# Patient Record
Sex: Male | Born: 1940 | Race: White | Hispanic: No | Marital: Married
Health system: Midwestern US, Academic
[De-identification: ages and names within clinical notes are randomized; demographics above are authoritative.]

---

## 2017-02-22 ENCOUNTER — Encounter: Admit: 2017-02-22 | Discharge: 2017-02-22 | Payer: MEDICARE

## 2017-02-22 ENCOUNTER — Ambulatory Visit: Admit: 2017-02-22 | Discharge: 2017-02-22 | Payer: MEDICARE

## 2017-02-22 DIAGNOSIS — R1319 Other dysphagia: ICD-10-CM

## 2017-02-22 DIAGNOSIS — I00 Rheumatic fever without heart involvement: ICD-10-CM

## 2017-02-22 DIAGNOSIS — E785 Hyperlipidemia, unspecified: ICD-10-CM

## 2017-02-22 DIAGNOSIS — R196 Halitosis: ICD-10-CM

## 2017-02-22 DIAGNOSIS — Z862 Personal history of diseases of the blood and blood-forming organs and certain disorders involving the immune mechanism: ICD-10-CM

## 2017-02-22 DIAGNOSIS — I10 Essential (primary) hypertension: ICD-10-CM

## 2017-02-22 DIAGNOSIS — I2510 Atherosclerotic heart disease of native coronary artery without angina pectoris: Principal | ICD-10-CM

## 2017-02-22 DIAGNOSIS — K625 Hemorrhage of anus and rectum: ICD-10-CM

## 2017-02-22 DIAGNOSIS — Z87891 Personal history of nicotine dependence: ICD-10-CM

## 2017-02-22 DIAGNOSIS — K210 Gastro-esophageal reflux disease with esophagitis: ICD-10-CM

## 2017-02-22 DIAGNOSIS — Z1211 Encounter for screening for malignant neoplasm of colon: ICD-10-CM

## 2017-02-22 DIAGNOSIS — R05 Cough: ICD-10-CM

## 2017-02-22 DIAGNOSIS — E039 Hypothyroidism, unspecified: ICD-10-CM

## 2017-02-22 DIAGNOSIS — I491 Atrial premature depolarization: ICD-10-CM

## 2017-02-22 DIAGNOSIS — K219 Gastro-esophageal reflux disease without esophagitis: ICD-10-CM

## 2017-02-22 DIAGNOSIS — R1310 Dysphagia, unspecified: Principal | ICD-10-CM

## 2017-02-22 DIAGNOSIS — I219 Acute myocardial infarction, unspecified: ICD-10-CM

## 2017-02-22 MED ORDER — SODIUM CHLORIDE 0.9 % IV SOLP
INTRAVENOUS | 0 refills | Status: CN
Start: 2017-02-22 — End: ?

## 2017-02-22 NOTE — Progress Notes
Date of Service: 02/22/2017    Subjective:             Jack Wagner is a 76 y.o. male whom will be a patient of Dr. 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    History of Present Illness  [name] and his wife and daughter present to the clinic for initial evaluation.  He reports that he has been having difficulty swallowing pills, food, and drinking fluids for the last 3 years.  He reports that when he drinks fluids sometimes it goes down the wrong pipe and it causes him to choke.  He also reports that both food and pills get stuck in his throat; he denies needing to disimpacted digitally or prompt vomiting to get it out; it eventually goes down or comes back up on its own.  He reports that he has been told he has esophagitis per EGD which was done in [place] for which he was given Gaviscon but did not take it regularly.  Outside EGD unavailable for review at this time.  He denies typical symptoms of GERD over his lifetime which include substernal burning and regurgitation of sour fluid.  He reports that he has between [facility] and [place] 7 bowel movements.  He reports that predominantly he has [place] 4 bowel movements once per day but on occasion has hard stools followed by liquid stool.  He reports that he has some low volume bright red blood with wiping on occasion which resolves itself.  He attributes this to hemorrhoids.  His last colonoscopy was approximately 9 years ago.  He believes that no polyps or abnormalities were found on his most recent colonoscopy and he was directed to repeat in 10 years.  He reports that his taste and breath has changed since he quit smoking and now resembles that of fecal matter.  He reports that he has a chronic cough and chronically spits out dark mucus/sputum.  Outside colonoscopy unavailable for review at this time.  He had a heart attack 10/2016 and at this time he quit smoking and chewing tobacco after a 50 year history in addition to significantly changing his diet to avoid fat and sugar therefore he has lost 15 pounds since this time.  He denies pain with swallowing, nausea, vomiting, melena, hematemesis, and abdominal pain.    Past Medical History:   Diagnosis Date   ??? CAD (coronary artery disease) 11/12/2008   ??? HLD (hyperlipidemia) 11/12/2008   ??? HTN (hypertension) 11/12/2008   ??? Hypothyroidism 11/12/2008   ??? Myocardial infarction [facility])    ??? PAC (premature atrial contraction) 04/08/2010   ??? Rheumatic fever    ??? Rheumatic fever     when young     Past Surgical History:   Procedure Laterality Date   ??? HX APPENDECTOMY     ??? HX HEART CATHETERIZATION     ??? HX ROTATOR CUFF REPAIR     ??? HX TONSILLECTOMY       Family History   Problem Relation Age of Onset   ??? Heart Attack Brother    ??? Diabetes Brother    ??? Heart Attack Brother    ??? Diabetes Brother    ???  Heart Attack Brother    ??? Heart Attack Brother    ??? Stroke Father    ??? Heart Attack Sister    ??? Diabetes Maternal Grandfather      Social History     Social History   ??? Marital status: Married     Spouse name: N/A   ??? Number of children: N/A   ??? Years of education: N/A     Social History Main Topics   ??? Smoking status: Former Smoker     Years: 50.00     Types: Cigars, Pipe     Quit date: 11/23/2016   ??? Smokeless tobacco: Former Neurosurgeon     Types: Chew     Quit date: 11/23/2016      Comment: smokes pipe and chews tobacco daily   ??? Alcohol use Yes      Comment: 1-2/ week.  Vague with answer   ??? Drug use: No   ??? Sexual activity: Not on file     Other Topics Concern   ??? Not on file     Social History Narrative   ??? No narrative on file Review of Systems   Constitutional: Positive for activity change, fatigue and unexpected weight change.   HENT: Positive for hearing loss and trouble swallowing.    Eyes: Negative.    Respiratory: Positive for cough and choking.    Cardiovascular: Negative.    Gastrointestinal: Negative.    Endocrine: Negative.    Genitourinary: Negative.    Musculoskeletal: Positive for back pain and neck stiffness.   Skin: Negative.    Allergic/Immunologic: Negative.    Neurological: Negative.    Hematological: Negative.    Psychiatric/Behavioral: Negative.    All other systems reviewed and are negative.        Objective:         ??? Aspirin 81 mg Tab Take 1 Tab by mouth Daily.   ??? Calcium Carbonate (CORAL CALCIUM) 1,000 mg tab Take 1 tablet by mouth daily.   ??? cyanocobalamin (VITAMIN B-12) 1,000 mcg tablet Take 1,000 mcg by mouth daily.   ??? FOLIC ACID/MV,FE,OTHER MIN (CENTRUM PO) Take 1 tablet by mouth daily.   ??? krill-om-3-dha-epa-phospho-ast (MEGARED OMEGA-3 KRILL OIL) 300-90-24-50 mg cap Take 1 capsule by mouth daily.   ??? lansoprazole(+) DR (PREVACID) 15 mg capsule Take 15 mg by mouth daily 30 minutes before breakfast.   ??? levothyroxine (SYNTHROID) 112 mcg tablet Take 1 tablet by mouth daily 30 minutes before breakfast.   ??? lisinopril (PRINIVIL; ZESTRIL) 20 mg tablet Take 1 tablet by mouth twice daily.   ??? neomycin/bacitracin/polymyxin (NEOSPORIN) topical ointment packet Apply to laceration on right thumb when changing bandage. This medication may be purchased from over the counter from your preferred pharmacy   ??? nicotine polacrilex (COMMIT) 4 mg lozenge Place 1 lozenge inside cheek (side of mouth) as Needed. For cravings.   ??? nitroglycerin (NITROSTAT) 0.4 mg tablet Place 1 tablet under tongue every 5 minutes as needed for Chest Pain.   ??? Potassium Gluconate 595 mg (99 mg) tab Take 1 tablet by mouth daily.   ??? rosuvastatin (CRESTOR) 20 mg tablet Take 1 tablet by mouth daily. ??? selenium 200 mcg cap Take 1 capsule by mouth daily.   ??? sertraline (ZOLOFT) 50 mg tablet Take 25 mg by mouth daily.   ??? ticagrelor (BRILINTA) 90 mg Take 1 tablet by mouth twice daily.     Vitals:    02/22/17 0930   BP: (!) 87/46  Pulse: 45   Resp: 16   Temp: 37 ???C (98.6 ???F)   Weight: 77.5 kg (170 lb 12.8 oz)   Height: 182.9 cm (72.01)     Body mass index is 23.16 kg/m???.     Physical Exam   Constitutional: He is oriented to person, place, and time. He appears well-developed and well-nourished.   HENT:   Head: Normocephalic and atraumatic.   Eyes: EOM are normal. Pupils are equal, round, and reactive to light.   Neck: Normal range of motion. Neck supple. No tracheal deviation present. No thyromegaly present.   Cardiovascular: Normal rate, regular rhythm and normal heart sounds.    Pulmonary/Chest: Effort normal and breath sounds normal.   Abdominal: Soft. Bowel sounds are normal. He exhibits no distension. There is no tenderness.   Musculoskeletal: Normal range of motion.   Neurological: He is alert and oriented to person, place, and time.   Skin: Skin is warm and dry.   Psychiatric: He has a normal mood and affect. His behavior is normal. Judgment and thought content normal.            Assessment and Plan:  Jack Wagner is a 76 y.o. male whom will be a patient of Dr. 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    After discussion with the patient it was decided to proceed with the following:  Check with Cardiology to see if [facility] can be stopped for procedure  If so consider...  -EGD with biopsies to rule out eosinophilic esophagitis and to screen for Barrett's esophagus in the setting of worsening dysphasia ???3 years, history of GERD with esophagitis, and smoking history  -Colonoscopy to rule out colorectal cancer in the setting of new anemia  If not consider deferring for one year vs. Diagnostic; will discuss with Dr. Noah Charon   Referral to pulmonology as patient has long history of smoking, chronic cough and brown sputum  Discuss taste change and breath with dentist  Change Prevacid to 30 minutes to 1 hour prior to first meal of the day  Over-the-counter preparation H as needed for bright red blood with wiping which is likely attributed to hemorrhoids  20 g of fiber per day  Drink at least 80 ounces of water per day  Walk/exercise for at least 30 minutes per day  GERD lifestyle modifications  Continue tobacco cessation  Call cardiologist and discuss blood pressure medication as blood pressure was pretty low at today's appointment  In the future we can consider H2 breath test to rule out small intestinal bacterial overgrowth and further anemia workup which could include iron panel, B12, and folate.  Return to clinic in 6 weeks with nurse practitioner Gerhard Munch  Next available with Dr. Noah Charon    Risks versus benefits of procedures were discussed with the patient.  All questions were answered.  Thank you for allowing me to participate in the care of this patient.  Please call GI clinic with any questions/concerns.  Mikeal Hawthorne, APRN-NP

## 2017-02-22 NOTE — Telephone Encounter
Upon discharging patient from clinic appt on 02/22/17 and discussing EGd/Colon prep insturctions. Pt reports had heart attack 10/2016 and was advised by cardiology anticoagulation therapy cannot be interrupted x 1 year. Advised pt would further investigate and discuss  with provider.     Routing to [place] to inform of pts report as well as cardiologist Dr. Avie Arenas

## 2017-02-23 ENCOUNTER — Ambulatory Visit: Admit: 2017-02-23 | Discharge: 2017-02-23 | Payer: MEDICARE

## 2017-02-23 DIAGNOSIS — Z87891 Personal history of nicotine dependence: ICD-10-CM

## 2017-02-23 DIAGNOSIS — Z862 Personal history of diseases of the blood and blood-forming organs and certain disorders involving the immune mechanism: ICD-10-CM

## 2017-02-23 DIAGNOSIS — K210 Gastro-esophageal reflux disease with esophagitis: ICD-10-CM

## 2017-02-23 DIAGNOSIS — K625 Hemorrhage of anus and rectum: Principal | ICD-10-CM

## 2017-02-23 DIAGNOSIS — R1310 Dysphagia, unspecified: ICD-10-CM

## 2017-02-23 DIAGNOSIS — Z1211 Encounter for screening for malignant neoplasm of colon: ICD-10-CM

## 2017-03-02 NOTE — Telephone Encounter
Pt wife left detailed msg reports pt is scheduled for EGD/Colon on 03/09/17 however they have not heard back from office if ok to proceed since pt had heart attack and cannot interrupt anticoagulation therapy. Pt wife also reports in msg that pt will be on vacation the whole week on 03/09/17 so he will not be able to make this appt regardless if ok to proceed/not     ROuting to Jill Side if she has further guidance and if she has discussed pts case with Dr. Noah Charon

## 2017-03-06 ENCOUNTER — Encounter: Admit: 2017-03-06 | Discharge: 2017-03-06 | Payer: MEDICARE

## 2017-03-08 NOTE — Telephone Encounter
Informed pt wife Berkley Harvey on file to discuss PHI) of below. PT wife aware pt has future appt with Dr. Noah Charon 04/10/17 12:30 and that the 03/27/17 appt w Jill Side will be cancelled. Pt wife verbalized understanding.

## 2017-03-08 NOTE — Telephone Encounter
After discussion with Jill Side VO obtained to cancel 03/27/17 appt with Jill Side and discuss with Dr. Noah Charon if pt should proceed with EGD/Colon (without bx) and/or further recommendations since pt cannot stop anticoagulation therapy x 1 year during 04/10/17 appt with Dr. Noah Charon.

## 2017-03-30 ENCOUNTER — Ambulatory Visit: Admit: 2017-03-30 | Discharge: 2017-03-31 | Payer: MEDICARE

## 2017-03-30 ENCOUNTER — Encounter: Admit: 2017-03-30 | Discharge: 2017-03-30 | Payer: MEDICARE

## 2017-03-30 DIAGNOSIS — E7800 Pure hypercholesterolemia, unspecified: ICD-10-CM

## 2017-03-30 DIAGNOSIS — I10 Essential (primary) hypertension: ICD-10-CM

## 2017-03-30 DIAGNOSIS — I491 Atrial premature depolarization: Principal | ICD-10-CM

## 2017-03-30 DIAGNOSIS — I471 Supraventricular tachycardia: ICD-10-CM

## 2017-03-30 DIAGNOSIS — I00 Rheumatic fever without heart involvement: ICD-10-CM

## 2017-03-30 DIAGNOSIS — K219 Gastro-esophageal reflux disease without esophagitis: ICD-10-CM

## 2017-03-30 DIAGNOSIS — E785 Hyperlipidemia, unspecified: ICD-10-CM

## 2017-03-30 DIAGNOSIS — I219 Acute myocardial infarction, unspecified: ICD-10-CM

## 2017-03-30 DIAGNOSIS — I2510 Atherosclerotic heart disease of native coronary artery without angina pectoris: Principal | ICD-10-CM

## 2017-03-30 DIAGNOSIS — Z87891 Personal history of nicotine dependence: ICD-10-CM

## 2017-03-30 DIAGNOSIS — E039 Hypothyroidism, unspecified: ICD-10-CM

## 2017-03-30 DIAGNOSIS — I952 Hypotension due to drugs: ICD-10-CM

## 2017-03-30 DIAGNOSIS — I25110 Atherosclerotic heart disease of native coronary artery with unstable angina pectoris: Principal | ICD-10-CM

## 2017-03-30 DIAGNOSIS — R634 Abnormal weight loss: ICD-10-CM

## 2017-03-30 MED ORDER — LISINOPRIL 20 MG PO TAB
20 mg | ORAL_TABLET | Freq: Every day | ORAL | 3 refills | Status: AC
Start: 2017-03-30 — End: 2017-10-03

## 2017-03-30 NOTE — Progress Notes
Date of Service: 03/30/2017    Jack Wagner is a 76 y.o. male.       HPI     Patient is a 76 year old white male with a history of coronary artery disease.  [facility] after experiencing a few weeks of not feeling well, and bradycardia.  He initially presented at the outside hospital where he was found to have EKG changes and elevated troponin.    Patient did undergo a left heart catheterization, this resulted in PCI of the proximal RCA with a drug-eluting stent on 11/23/2016.    Patient has been bradycardic and therefore a beta-blocker was not initiated.    He is here today accompanied by his wife.  Jack Wagner does report feeling fatigued and tired and just not having the same level of energy.  He  he also lost weight, approximately 9 pounds since Jan 05, 2017, at that time patient weighed 175 pounds and today he weighs 166 pounds.  Per wife's report he was also found to be borderline diabetic and he dramatically increased the sugar and the carbohydrate intake.    Patient has not experienced symptoms of chest pain or heart palpitations, no no presyncope or syncope.    Today in our office the blood pressure is 96/54 mmHg and left arm and 94/50 mmHg in the right arm with a heart rate of 74 bpm.    During this admission at [facility] he was also found to be hyper thyroid and therefore the dose of levothyroxine was adjusted.         Vitals:    03/30/17 0912 03/30/17 0919   BP: 96/54 94/50   Pulse: 74    Weight: 75.3 kg (166 lb)    Height: 1.829 m (6')      Body mass index is 22.51 kg/m???.     Past Medical History  Patient Active Problem List    Diagnosis Date Noted   ??? BRBPR (bright red blood per rectum) 02/23/2017     Added automatically from request for surgery [id number]     ??? Colon cancer screening 02/23/2017     Added automatically from request for surgery [id number]     ??? History of anemia 02/23/2017     Added automatically from request for surgery [phone] ??? Hx of smoking 02/23/2017     Added automatically from request for surgery [phone]     ??? Dysphagia 02/23/2017     Added automatically from request for surgery [id number]     ??? Gastroesophageal reflux disease with esophagitis 02/22/2017   ??? Cough 02/22/2017   ??? Bad breath 02/22/2017   ??? Bright red rectal bleeding 02/22/2017   ??? Other dysphagia 02/22/2017   ??? Prediabetes 11/25/2016   ??? Atrial tachycardia (HCC) 11/24/2016   ??? 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 Bradycardia 05/16/2013   ??? Right carotid bruit 05/16/2013   ??? Dyspnea 03/01/2011   ??? History of tobacco use 03/01/2011   ??? PAC (premature atrial contraction) 04/08/2010   ??? Abnormal cardiovascular stress test 11/12/2008   ??? CAD (coronary artery disease) 11/12/2008     11/12/08: Cath - Ostial left main stenosis of 20-30%. Proximal LAD stenosis of 30-40%. Diagonal ostial stenosis of 60%. Mid circumflex with 30-40% stenosis. Mid RCA with a 30-40% stenosis. Medical management.    11/23/16: STEMI/Cath - Culprit lesion, subtotal 99% proximal RCA acute thrombotic lesion s/p PCI with a  3.5 x 33 mm Xience Alpine DES. Ostial 20-30% left main lesion, which appears angiographically unchanged compared to the previous films.       ??? HTN (hypertension) 11/12/2008   ??? HLD (hyperlipidemia) 11/12/2008   ??? Hypothyroidism 11/12/2008         Review of Systems   Constitution: Positive for weight loss.   HENT: Positive for hearing loss.    Eyes: Negative.    Cardiovascular: Negative.    Respiratory: Positive for cough, snoring and sputum production.    Endocrine: Negative.    Hematologic/Lymphatic: Negative.    Skin: Negative.    Musculoskeletal: Positive for back pain, joint pain and muscle cramps.   Gastrointestinal: Positive for dysphagia.   Genitourinary: Negative.    Neurological: Negative.    Psychiatric/Behavioral: Negative.    Allergic/Immunologic: Negative.        Physical Exam  General Appearance: normal in appearance Skin: warm, moist, no ulcers or xanthomas  Eyes: conjunctivae and lids normal, pupils are equal and round  Lips & Oral Mucosa: no pallor or cyanosis  Neck Veins: neck veins are flat, neck veins are not distended  Chest Inspection: chest is normal in appearance  Respiratory Effort: breathing comfortably, no respiratory distress  Auscultation/Percussion: lungs clear to auscultation, no rales or rhonchi, no wheezing  Cardiac Rhythm: regular rhythm and normal rate  Cardiac Auscultation: S1, S2 normal, no rub, no gallop  Murmurs: no murmur  Carotid Arteries: normal carotid upstroke bilaterally, no bruit  Lower Extremity Edema: no lower extremity edema  Abdominal Exam: soft, non-tender, no masses, bowel sounds normal  Liver & Spleen: no organomegaly  Language and Memory: patient responsive and seems to comprehend information  Neurologic Exam: neurological assessment grossly intact       Cardiovascular Studies      Problems Addressed Today  Encounter Diagnoses   Name Primary?   ??? Coronary artery disease involving native coronary artery of native heart with unstable angina pectoris (HCC) Yes   ??? Essential hypertension    ??? PAC (premature atrial contraction)    ??? Pure hypercholesterolemia    ??? History of tobacco use    ??? Weight loss    ??? Hypotension due to drugs        Assessment and Plan     Assessment:    1.  Stable ischemic heart disease  ??? Patient underwent primary PCI of the RCA on 11/23/2016 with a drug-eluting stent  ??? At the same time he was found to have 20-30% left main lesion, this  lesion was unchanged from a previous left heart catheterization performed in March 2010  ??? Patient has not been expressing symptoms of chest pain, has not taken any sublingual nitroglycerin    2.  Hypotension  ??? This is probably secondary to lisinopril, patient takes 20 mg p.o. twice daily  ??? She also has been reporting fatigue and lack of energy  ??? In the past this patient did have a relative bradycardia and therefore the beta-blocker was not started    3.  Weight loss  ??? This is probably intentional, patient dramatically decreased sugar and carbohydrate intake    4.  Present workup in the GI department  ??? Patient has been expressing symptoms of dysphagia  ??? He does have a upcoming appointment on April 10, 2017 to discuss further treatment.    Plan:    1.  Decrease lisinopril to 20 mg p.o. daily  2.  The laboratory work will consist of Chem-7, T4  and TSH  3.  From a cardiac standpoint is patient is stable to undergo further GI workup without any restrictions   ??? it is okay to discontinue ticagrelor 5-7 days before the GI procedure, I do recommend to resume as soon as considered safe from a GI standpoint.  4.  Follow-up office visit with me in 3-4 months    Addendum: Due to relative bradycardia recorded during cardiac rehab, and the resting heart rate of approximately 40-50 bpm (patient has been asymptomatic with this) we will proceed with further assessment to be done 14 days Holter monitor, our office will call the patient to set this procedure up.           Current Medications (including today's revisions)  ??? Aspirin 81 mg Tab Take 1 Tab by mouth Daily.   ??? Calcium Carbonate (CORAL CALCIUM) 1,000 mg tab Take 1 tablet by mouth daily.   ??? krill-om-3-dha-epa-phospho-ast (MEGARED OMEGA-3 KRILL OIL) 300-90-24-50 mg cap Take 1 capsule by mouth daily.   ??? lansoprazole(+) DR (PREVACID) 15 mg capsule Take 15 mg by mouth daily 30 minutes before breakfast.   ??? levothyroxine (SYNTHROID) 112 mcg tablet Take 1 tablet by mouth daily 30 minutes before breakfast.   ??? lisinopril (PRINIVIL; ZESTRIL) 20 mg tablet Take 1 tablet by mouth twice daily.   ??? nitroglycerin (NITROSTAT) 0.4 mg tablet Place 1 tablet under tongue every 5 minutes as needed for Chest Pain.   ??? Potassium Gluconate 595 mg (99 mg) tab Take 1 tablet by mouth daily.   ??? rosuvastatin (CRESTOR) 20 mg tablet Take 1 tablet by mouth daily. ??? selenium 200 mcg cap Take 1 capsule by mouth daily.   ??? sertraline (ZOLOFT) 50 mg tablet Take 25 mg by mouth daily.   ??? ticagrelor (BRILINTA) 90 mg Take 1 tablet by mouth twice daily.

## 2017-04-10 ENCOUNTER — Ambulatory Visit: Admit: 2017-04-10 | Discharge: 2017-04-10 | Payer: MEDICARE

## 2017-04-10 ENCOUNTER — Encounter: Admit: 2017-04-10 | Discharge: 2017-04-10 | Payer: MEDICARE

## 2017-04-10 DIAGNOSIS — R1013 Epigastric pain: Principal | ICD-10-CM

## 2017-04-10 DIAGNOSIS — R1310 Dysphagia, unspecified: ICD-10-CM

## 2017-04-10 DIAGNOSIS — R432 Parageusia: Principal | ICD-10-CM

## 2017-04-10 DIAGNOSIS — I00 Rheumatic fever without heart involvement: ICD-10-CM

## 2017-04-10 DIAGNOSIS — E039 Hypothyroidism, unspecified: ICD-10-CM

## 2017-04-10 DIAGNOSIS — M3500 Sicca syndrome, unspecified: ICD-10-CM

## 2017-04-10 DIAGNOSIS — K219 Gastro-esophageal reflux disease without esophagitis: ICD-10-CM

## 2017-04-10 DIAGNOSIS — I10 Essential (primary) hypertension: ICD-10-CM

## 2017-04-10 DIAGNOSIS — R638 Other symptoms and signs concerning food and fluid intake: ICD-10-CM

## 2017-04-10 DIAGNOSIS — I491 Atrial premature depolarization: ICD-10-CM

## 2017-04-10 DIAGNOSIS — E785 Hyperlipidemia, unspecified: ICD-10-CM

## 2017-04-10 DIAGNOSIS — I219 Acute myocardial infarction, unspecified: ICD-10-CM

## 2017-04-10 DIAGNOSIS — I2510 Atherosclerotic heart disease of native coronary artery without angina pectoris: Principal | ICD-10-CM

## 2017-04-10 LAB — CBC AND DIFF
Lab: 0 10*3/uL (ref 0–0.20)
Lab: 0.2 10*3/uL (ref 0–0.45)
Lab: 0.6 10*3/uL (ref 0–0.80)
Lab: 1 % (ref 0–2)
Lab: 1.3 10*3/uL (ref 1.0–4.8)
Lab: 13 % (ref 11–15)
Lab: 13 % — ABNORMAL HIGH (ref 4–12)
Lab: 25 % (ref 24–44)
Lab: 3 10*3/uL (ref 1.8–7.0)
Lab: 3.6 M/UL — ABNORMAL LOW (ref 4.4–5.5)
Lab: 5 % (ref 0–5)
Lab: 5.2 10*3/uL (ref 4.5–11.0)

## 2017-04-10 LAB — IRON + BINDING CAPACITY + %SAT+ FERRITIN
Lab: 240 ng/mL (ref 30–300)
Lab: 341 ug/dL — ABNORMAL LOW (ref 270–380)
Lab: 87 ug/dL — ABNORMAL LOW (ref 50–185)

## 2017-04-10 LAB — ANTI SSA ANTI SSB AB

## 2017-04-10 LAB — FOLATE, SERUM: Lab: >24 ng/mL — ABNORMAL LOW (ref >3.9)

## 2017-04-10 LAB — VITAMIN B12: Lab: 956 pg/mL — ABNORMAL HIGH (ref 180–914)

## 2017-04-10 MED ORDER — SODIUM CHLORIDE 0.9 % IV SOLP
INTRAVENOUS | 0 refills | Status: CN
Start: 2017-04-10 — End: ?

## 2017-04-11 LAB — ZINC: Lab: 0.7 % (ref 41–77)

## 2017-04-13 ENCOUNTER — Encounter: Admit: 2017-04-13 | Discharge: 2017-04-13 | Payer: MEDICARE

## 2017-04-13 ENCOUNTER — Ambulatory Visit: Admit: 2017-04-13 | Discharge: 2017-04-14 | Payer: MEDICARE

## 2017-04-13 DIAGNOSIS — I491 Atrial premature depolarization: Principal | ICD-10-CM

## 2017-04-13 DIAGNOSIS — R001 Bradycardia, unspecified: ICD-10-CM

## 2017-04-13 DIAGNOSIS — I471 Supraventricular tachycardia: ICD-10-CM

## 2017-04-13 NOTE — Progress Notes
Date of Service: 04/10/2017    Subjective     Jack Wagner is a 76 y.o. male    Chief Complaint   Dysgeusia and indigestion      ??? Aspirin 81 mg Tab Take 1 Tab by mouth Daily.   ??? Calcium Carbonate (CORAL CALCIUM) 1,000 mg tab Take 1 tablet by mouth daily.   ??? krill-om-3-dha-epa-phospho-ast (MEGARED OMEGA-3 KRILL OIL) 300-90-24-50 mg cap Take 1 capsule by mouth daily.   ??? lansoprazole(+) DR (PREVACID) 15 mg capsule Take 15 mg by mouth daily 30 minutes before breakfast.   ??? levothyroxine (SYNTHROID) 112 mcg tablet Take 1 tablet by mouth daily 30 minutes before breakfast.   ??? lisinopril (PRINIVIL; ZESTRIL) 20 mg tablet Take 1 tablet by mouth daily.   ??? nitroglycerin (NITROSTAT) 0.4 mg tablet Place 1 tablet under tongue every 5 minutes as needed for Chest Pain.   ??? Potassium Gluconate 595 mg (99 mg) tab Take 1 tablet by mouth daily.   ??? rosuvastatin (CRESTOR) 20 mg tablet Take 1 tablet by mouth daily.   ??? selenium 200 mcg cap Take 1 capsule by mouth daily.   ??? sertraline (ZOLOFT) 50 mg tablet Take 25 mg by mouth daily.   ??? ticagrelor (BRILINTA) 90 mg Take 1 tablet by mouth twice daily.       History of Present Illness  A 76 year old white male, accompanied by his wife  -History of gastroesophageal reflux disease  -History of coronary artery disease; myocardial infarction in March 2018  -History of hyperlipidemia  -History of hypertension  -History of hypothyroidism    He is here to discuss his ongoing complaints of bad taste in the mouth as well as indigestion.  History was obtained from the patient as well as wife.  The patient reports that intermittently he has been having difficulty swallowing pills and perhaps trouble swallowing liquids for the past 3-4 years.  He also reports choking.  He denies significant dysphagia to food.  Apparently he underwent an upper endoscopy at her hospital in [place] in the past that may have shown esophagitis.  The patient has been on Prevacid 15 mg a day for a long time.  He reports that he quit smoking in March 2018 when he had an MI.  Since then his taste sensation has deteriorated: Nothing tastes good, smell of feces.  He also has lost weight but his wife believes that that is because he has cut back significantly on his intake of processed foods and cookies.  The patient denies family history of cancer of the esophagus but has had smoked for around 30-40 years and is at risk for cancer of the esophagus.  He also reports lots of phlegm.  Initially it was proposed that his altered taste sensation could be because of tooth problems.  He saw a dentist who did dental work but not lead to improvement in symptoms.  1 of the main factors is that the patient has restricted salt in his diet since his MI in March 2018.    Some of the notes mentioned hematochezia small-volume today the patient denied    The patient reportedly has had 2 colonoscopies in the past approximately 10 years apart on the outside and both were reportedly normal.    The patient was scheduled for both an upper endoscopy and a colonoscopy but were canceled because the patient could not come off the blood thinner Brilinta at least for the next year.     Family History  The patient denies family history of colon cancer but his sister had an MI at the age of 77.    Social History  The patient has been smoking since the age of 37 and quit in March 2018.  He smoked approximately 1-2 packs per day.  He denies abusing alcohol.    Review of systems  The patient denies fever, chills, melena or hematochezia. A 10 point review of systems was conducted and was otherwise negative.    No Known Allergies    Physical Exam  Vitals:    04/10/17 1419   BP: 115/46   Pulse: 41   Temp: 37.3 ???C (99.1 ???F)     Body mass index is 22.81 kg/m???.    General appearance  alert, cooperative, no distress, appears stated age   Head  Normocephalic, without obvious abnormality, atraumatic Eyes  conjunctivae/corneas clear. EOM's intact. pupils equally round, accommodation normal.   Nose Nares normal. No drainage or sinus tenderness.   Throat Lips, mucosa, and tongue normal. Teeth and gums normal   Neck supple, symmetrical, trachea midline, and no JVD   Back   symmetric, no curvature. ROM normal. No CVA tenderness   Lungs   clear to auscultation bilaterally   Chest wall  no tenderness   Heart  regular rate and rhythm, S1, S2 normal, no murmur, click, rub or gallop   Abdomen   soft, non-tender. Bowel sounds normal. No masses,  No organomegaly   Extremities extremities normal, atraumatic, no cyanosis or edema   Pulses 2+ and symmetric   Skin Skin color, texture, turgor normal. No rashes or lesions   Neurologic No focal findings     Review of Labs    CBC w/Diff    Lab Results   Component Value Date/Time    WBC 5.2 04/10/2017 03:24 PM    RBC 3.61 (L) 04/10/2017 03:24 PM    HGB 11.9 (L) 04/10/2017 03:24 PM    HCT 36.2 (L) 04/10/2017 03:24 PM    MCV 100.3 (H) 04/10/2017 03:24 PM    MCH 32.9 04/10/2017 03:24 PM    MCHC 32.8 04/10/2017 03:24 PM    RDW 13.6 04/10/2017 03:24 PM    PLTCT 205 04/10/2017 03:24 PM    MPV 7.5 04/10/2017 03:24 PM    Lab Results   Component Value Date/Time    NEUT 56 04/10/2017 03:24 PM    ANC 3.00 04/10/2017 03:24 PM    LYMA 25 04/10/2017 03:24 PM    ALC 1.30 04/10/2017 03:24 PM    MONA 13 (H) 04/10/2017 03:24 PM    AMC 0.60 04/10/2017 03:24 PM    EOSA 5 04/10/2017 03:24 PM    AEC 0.20 04/10/2017 03:24 PM    BASA 1 04/10/2017 03:24 PM    ABC 0.00 04/10/2017 03:24 PM        Comprehensive Metabolic Profile    Lab Results   Component Value Date/Time    NA 138 11/25/2016 04:15 AM    K 4.0 11/25/2016 04:15 AM    CL 106 11/25/2016 04:15 AM    CO2 25 11/25/2016 04:15 AM    GAP 7 11/25/2016 04:15 AM    BUN 18 11/25/2016 04:15 AM    CR 1.09 11/25/2016 04:15 AM    GLU 102 (H) 11/25/2016 04:15 AM    Lab Results   Component Value Date/Time    CA 9.3 11/25/2016 04:15 AM PO4 3.5 11/23/2016 02:45 PM    ALBUMIN 3.9 11/23/2016 02:45 PM    TOTPROT  6.4 11/23/2016 02:45 PM    ALKPHOS 73 11/23/2016 02:45 PM    AST 31 11/23/2016 02:45 PM    ALT 29 11/23/2016 02:45 PM    TOTBILI 0.5 11/23/2016 02:45 PM    GFR >60 11/25/2016 04:15 AM    GFRAA >60 11/25/2016 04:15 AM        Radiology/Other Diagnostic Tests  No recent tests to review       Assessment and Plan  A 76 year old white male, accompanied by his wife, who presents with his complaints of dysgeusia and indigestion.  His symptoms have been ongoing for the past 3 months not affecting his quality of life especially because the food does not taste the same as before.      History was obtained from the patient as well as wife.  The patient reports that intermittently he has been having difficulty swallowing pills and perhaps trouble swallowing liquids for the past 3-4 years.  He also reports choking.  He denies significant dysphagia to food.  Apparently he underwent an upper endoscopy at her hospital in [place] in the past that may have shown esophagitis.  The patient has been on Prevacid 15 mg a day for a long time.  He reports that he quit smoking in March 2018 when he had an MI.  Since then his taste sensation has deteriorated: Nothing tastes good, smell of feces.  He also has lost weight but his wife believes that that is because he has cut back significantly on his intake of processed foods and cookies.  The patient denies family history of cancer of the esophagus but has had smoked for around 30-40 years and is at risk for cancer of the esophagus.  He also reports lots of phlegm.  Initially it was proposed that his altered taste sensation could be because of tooth problems.  He saw a dentist who did dental work but not lead to improvement in symptoms.  1 of the main factors is that the patient has restricted salt in his diet since his MI in March 2018.    We had a long discussion about possible explanations of dysgeusia that could include  myriad causes such as reflux disease, poor dentition, dental cavities, nutritional deficiencies such as zinc, hypothyroidism, kidney disease.  The patient has been a smoker which puts him at risk for esophageal cancer.  We will schedule him for an upper endoscopy even though the patient is on a blood thinner.  The plan is to do a diagnostic endoscopy without any dilation.  We will also limit the number of biopsies.  We will also do the workup for nutritional deficiencies.  Sjogren's syndrome could be another explanation.    The patient reports that he has had 2 colonoscopies in the past approximately 10 years apart without any problem such as polyps or cancer.  We will evaluate for iron deficiency anemia; if the patient is iron deficient then we will perform a diagnostic colonoscopy promptly else we will perform a colonoscopy in 1 year once he can come off Brilinta so that the polyps can be removed safely.  In the meanwhile the patient will inform us if he has hematochezia or melena or worsening anemia.            Suggest  1.  Repeat CBC with iron studies, B12  2.  ENT consult may be useful to evaluate for other oral causes of dyspepsia  3.  Upper endoscopy, diagnostic because the patient has been unrelenting  4.  Zinc level and TSH level  5.  Antibodies for Sjogren's syndrome  5.  GI will follow  6.  We will also request outside colonoscopy reports.  If the previous colonoscopies were indeed normal then it may be safe to hold off on the colonoscopy.  It may be preferable to perform a colonoscopy when the patient can, Brilinta in the near future; however he can perform a diagnostic colonoscopy on Brilinta if the need arises based on symptoms and review of outside records.     Time: I spent 25 minutes with the patient and her husband in a face-to-face manner today and over 50% of the time was spent counseling and coordinating care.  All questions were answered.

## 2017-04-13 NOTE — Progress Notes
Patient returned Zio after the device fell off.  He denies doing strenuous activity.  Monitor switched to a 14 day event monitor.  Order entered per [facility].

## 2017-04-13 NOTE — Progress Notes
Brand: CN (Cardionet or Cardiolab)    Type: Looping (Looping, Non-looping, etc.)    Length: 14 Days    Ordering Dr: Avie Arenas    Diagnosis: Bradycardia; Atrial Premature Contractions    Was insurance info faxed with enrollment: Yes

## 2017-04-17 ENCOUNTER — Encounter: Admit: 2017-04-17 | Discharge: 2017-04-17 | Payer: MEDICARE

## 2017-04-18 ENCOUNTER — Encounter: Admit: 2017-04-18 | Discharge: 2017-04-18 | Payer: MEDICARE

## 2017-04-18 NOTE — Progress Notes
Review the colonoscopy report from outside.    Date of colonoscopy completed at [facility] stone: August 2012.    Findings  1.  Diverticulosis  2.  No polyps.    Repeat was recommended in 10 years

## 2017-05-03 ENCOUNTER — Encounter: Admit: 2017-05-03 | Discharge: 2017-05-03 | Payer: MEDICARE

## 2017-05-03 ENCOUNTER — Ambulatory Visit: Admit: 2017-05-03 | Discharge: 2017-05-03 | Payer: MEDICARE

## 2017-05-03 DIAGNOSIS — I10 Essential (primary) hypertension: ICD-10-CM

## 2017-05-03 DIAGNOSIS — R432 Parageusia: Principal | ICD-10-CM

## 2017-05-03 DIAGNOSIS — K219 Gastro-esophageal reflux disease without esophagitis: ICD-10-CM

## 2017-05-03 DIAGNOSIS — Z87891 Personal history of nicotine dependence: ICD-10-CM

## 2017-05-03 DIAGNOSIS — E039 Hypothyroidism, unspecified: ICD-10-CM

## 2017-05-03 DIAGNOSIS — E785 Hyperlipidemia, unspecified: ICD-10-CM

## 2017-05-03 DIAGNOSIS — I2510 Atherosclerotic heart disease of native coronary artery without angina pectoris: ICD-10-CM

## 2017-05-03 DIAGNOSIS — I252 Old myocardial infarction: ICD-10-CM

## 2017-05-03 LAB — TOTAL THYROXINE T4: Lab: 7.5

## 2017-05-03 MED ORDER — LACTATED RINGERS IV SOLP
Freq: Once | INTRAVENOUS | 0 refills | Status: DC
Start: 2017-05-03 — End: 2017-05-03

## 2017-05-03 MED ORDER — LACTATED RINGERS IV SOLP
500 mL | Freq: Once | INTRAVENOUS | 0 refills | Status: CP
Start: 2017-05-03 — End: ?
  Administered 2017-05-03: 21:00:00 500 mL via INTRAVENOUS

## 2017-05-03 MED ORDER — LIDOCAINE (PF) 200 MG/10 ML (2 %) IJ SYRG
0 refills | Status: DC
Start: 2017-05-03 — End: 2017-05-03
  Administered 2017-05-03: 21:00:00 100 mg via INTRAVENOUS

## 2017-05-03 MED ORDER — LACTATED RINGERS IV SOLP (OR) 500ML
0 refills | Status: DC
Start: 2017-05-03 — End: 2017-05-03
  Administered 2017-05-03: 21:00:00 via INTRAVENOUS

## 2017-05-03 MED ORDER — SODIUM CHLORIDE 0.9 % IV SOLP
INTRAVENOUS | 0 refills | Status: DC
Start: 2017-05-03 — End: 2017-05-04

## 2017-05-03 MED ORDER — PROPOFOL INJ 10 MG/ML IV VIAL
0 refills | Status: DC
Start: 2017-05-03 — End: 2017-05-03
  Administered 2017-05-03: 21:00:00 30 mg via INTRAVENOUS
  Administered 2017-05-03: 21:00:00 50 mg via INTRAVENOUS
  Administered 2017-05-03: 21:00:00 20 mg via INTRAVENOUS

## 2017-05-03 NOTE — Discharge Planning (AHS/AVS)
EGD/Upper EUS/ERCP/Antegrade Enteroscopy  Post Upper Endoscopy Instructions      -Nothing to eat or drink for 1.5 hours after your procedure if you have had the numbing gargle or spray.  Start with small sips of water at _____________.  If tolerated well, you may advance your diet as tolerated or directed by your physician.      -You may have a sore throat after the procedure for 2-3 days.  Try sucrets or lozenges to help ease the pain.  If it continues please contact us.    -If you feel feverish, have a temperature of 101 degrees or higher, persistent nausea and vomiting, abdominal pain or dark stools; please notify your nurse or GI physician.    -You may have abdominal cramping following the procedure this can be relieved by belching or passing air.    -If you have redness or swelling at the IV site, place a warm, wet washcloth over the affected areas for 15 minutes, 3-4 times a day until the redness subsides.  If symptoms continue for 2-3 days, contact your regular physician.    - If you have bleeding from your mouth, over 2 tablespoons and increasing, please notify your physician.  A small amount of bleeding is normal if a biopsy or polyps were taken.  If you are vomiting blood you need to seek immediate medical attention.    - You may resume all your routine medications, if medications need to be held your physician and/or nurse will notify you post procedure.        OUTPATIENTS:  A. Because of sedation and lack of coordination, UNTIL TOMORROW, DO NOT:  1. Operate any motorized vehicle - this includes driving.  2. Sign any legal documents or conduct important business matters.  3. Use any dangerous machinery (chain saw, lawnmower, etc.).  4. Drink any alcoholic beverages.  Should you have any questions or concerns after your procedure please call [phone] M-F 8am-5:00 pm. After 5:00 pm, holidays or weekends call [phone] and ask for the GI Doctor on call.

## 2017-05-03 NOTE — Anesthesia Pre-Procedure Evaluation
Anesthesia Pre-Procedure Evaluation    Name: Jack Wagner      MRN: [id number]     DOB: 1941-04-26     Age: 76 y.o.     Sex: male   __________________________________________________________________________     Procedure Date: 05/03/2017   Procedure: Procedure(s):  ESOPHAGOGASTRODUODENOSCOPY     Physical Assessment  Vital Signs (last filed in past 24 hours):  BP: 142/66 (09/05 1535)  Temp: 36.4 ???C (97.5 ???F) (09/05 1535)  Pulse: 45 (09/05 1535)  Respirations: 14 PER MINUTE (09/05 1535)  SpO2: 99 % (09/05 1535)  O2 Delivery: None (Room Air) (09/05 1535)  Height: 182.9 cm (72) (09/05 1535)  Weight: 72.6 kg (160 lb) (09/05 1535)      Patient History  No Known Allergies     Current Medications    Medication Directions   Aspirin 81 mg Tab Take 1 Tab by mouth Daily.   Calcium Carbonate (CORAL CALCIUM) 1,000 mg tab Take 1 tablet by mouth daily.   krill-om-3-dha-epa-phospho-ast (MEGARED OMEGA-3 KRILL OIL) 300-90-24-50 mg cap Take 1 capsule by mouth daily.   lansoprazole(+) DR (PREVACID) 15 mg capsule Take 15 mg by mouth daily 30 minutes before breakfast.   levothyroxine (SYNTHROID) 112 mcg tablet Take 1 tablet by mouth daily 30 minutes before breakfast.   lisinopril (PRINIVIL; ZESTRIL) 20 mg tablet Take 1 tablet by mouth daily.   nitroglycerin (NITROSTAT) 0.4 mg tablet Place 1 tablet under tongue every 5 minutes as needed for Chest Pain.   Potassium Gluconate 595 mg (99 mg) tab Take 1 tablet by mouth daily.   rosuvastatin (CRESTOR) 20 mg tablet Take 1 tablet by mouth daily.   selenium 200 mcg cap Take 1 capsule by mouth daily.   sertraline (ZOLOFT) 50 mg tablet Take 25 mg by mouth daily.   ticagrelor (BRILINTA) 90 mg Take 1 tablet by mouth twice daily.         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    Abdominal Findings:         Abdomen soft    Neurological Findings:       Normal mental status       Diagnostic Tests  Hematology:   Lab Results   Component Value Date    HGB 11.9 04/10/2017    HCT 36.2 04/10/2017    PLTCT 205 04/10/2017    WBC 5.2 04/10/2017    NEUT 56 04/10/2017    ANC 3.00 04/10/2017    ALC 1.30 04/10/2017    MONA 13 04/10/2017    AMC 0.60 04/10/2017    EOSA 5 04/10/2017    ABC 0.00 04/10/2017    MCV 100.3 04/10/2017    MCH 32.9 04/10/2017    MCHC 32.8 04/10/2017    MPV 7.5 04/10/2017    RDW 13.6 04/10/2017         General Chemistry:   Lab Results   Component Value Date    NA 138 11/25/2016    K 4.0 11/25/2016    CL 106 11/25/2016    CO2 25 11/25/2016    GAP 7 11/25/2016    BUN 18 11/25/2016    CR 1.09 11/25/2016    GLU 102 11/25/2016    CA 9.3 11/25/2016    ALBUMIN 3.9 11/23/2016    MG 1.9 11/23/2016    TOTBILI 0.5 11/23/2016    PO4 3.5 11/23/2016      Coagulation:   Lab Results   Component Value Date    INR 1.0 11/23/2016         Anesthesia Plan    ASA score: 3   Plan: MAC  Induction method: intravenous  NPO status: acceptable      Informed Consent  Anesthetic plan and risks discussed with patient. Use of blood products discussed with patient  Blood Consent: consented      Plan discussed with: anesthesiologist and CRNA.

## 2017-05-03 NOTE — H&P (View-Only)
Pre Procedure History and Physical/Sedation Plan    Name:Jack Wagner                                                                   MRN: [id number]                 DOB:1940-10-23          Age: 76 y.o.  Date of Service: 05/03/17    Date of Procedure:  05/03/2017    Planned Procedure(s):  GI:  EGD  Sedation/Medication Plan: MAC (Monitored Anesthesia Care)  Discussion/Reviews:  Physician has discussed risks and alternatives of this type of sedation and above planned procedures with patient  ___________________________________________________________________  Chief Complaint:  Dysgeusia and acid reflux    History of Present Illness: Jack Wagner is a 76 y.o. male; Dysgeusia and acid reflux for the past 4-5 months    Previous Anesthetic/Sedation History:  reviewed    Past Medical History:   Diagnosis Date   ??? Acid reflux    ??? CAD (coronary artery disease) 11/12/2008   ??? HLD (hyperlipidemia) 11/12/2008   ??? HTN (hypertension) 11/12/2008   ??? Hypothyroidism 11/12/2008   ??? Myocardial infarction [facility])    ??? PAC (premature atrial contraction) 04/08/2010   ??? Rheumatic fever    ??? Rheumatic fever     when young     Past Surgical History:   Procedure Laterality Date   ??? HX APPENDECTOMY     ??? HX HEART CATHETERIZATION     ??? HX ROTATOR CUFF REPAIR     ??? HX TONSILLECTOMY       Pertinent medical/surgical history reviewed  Pertinent family history reviewed  Social History   Substance Use Topics   ??? Smoking status: Former Smoker     Years: 50.00     Types: Cigars, Pipe     Quit date: 11/23/2016   ??? Smokeless tobacco: Former Neurosurgeon     Types: Chew     Quit date: 11/23/2016      Comment: smokes pipe and chews tobacco daily   ??? Alcohol use 4.2 oz/week     3 Glasses of wine, 3 Cans of beer, 1 Shots of liquor per week     History   Drug Use No     Allergies:  Patient has no known allergies.  Medications  Current Facility-Administered Medications   Medication   ??? sodium chloride 0.9 %   infusion     Review of Systems:  Review obtained from health. Constitutional: negative  Respiratory: negative  Cardiovascular: negative  Gastrointestinal: negative  Neurological: negative           Physical Exam:  Temp: 36.4 ???C (97.5 ???F) (09/05 1535)  Pulse: 45 (09/05 1535)  Respirations: 14 PER MINUTE (09/05 1535)  BP: 142/66 (09/05 1535)  General appearance: alert  Throat: Lips, mucosa, and tongue normal. Teeth and gums normal  Lungs: clear to auscultation bilaterally  Heart: S1, S2 normal  Abdomen: soft, non-tender. Bowel sounds normal. No masses,  no organomegaly  Extremities: extremities normal, atraumatic, no cyanosis or edema  @  Airway:  airway assessment performed  Mallampati II (soft palate, uvula, fauces visible)  Anesthesia Classification:  Per Anesthesia  NPO Status: Acceptable  Pregnancy Status:  Not Pregnant    Lab/Radiology/Other Diagnostic Tests  Labs:  Relevant labs reviewed      Tommie Sams, MD  Pager

## 2017-05-03 NOTE — Anesthesia Post-Procedure Evaluation
Post-Anesthesia Evaluation    Name: Jack Wagner      MRN: [id number]     DOB: Apr 26, 1941     Age: 76 y.o.     Sex: male   __________________________________________________________________________     Procedure Date: 05/03/2017  Procedure: Procedure(s):  ESOPHAGOGASTRODUODENOSCOPY      Surgeon: Moishe Spice):  Tommie Sams, MD    Post-Anesthesia Vitals  BP: 122/68 (09/05 1622)  Temp: 36.4 C (97.5 F) (09/05 1619)  Pulse: 52 (09/05 1622)  Respirations: 18 PER MINUTE (09/05 1622)  SpO2: 98 % (09/05 1622)  O2 Delivery: None (Room Air) (09/05 1619)  SpO2 Pulse: 45 (09/05 1622)      Post Anesthesia Evaluation Note    Evaluation location: Pre/Post  Patient participation: recovered; patient participated in evaluation  Level of consciousness: alert  Pain management: adequate    Hydration: normovolemia  Airway patency: adequate    Perioperative Events  Perioperative events:  no       Post-op nausea and vomiting: no PONV    Postoperative Status  Cardiovascular status: hemodynamically stable and bradycardic (At baseline bradycardia)  Respiratory status: spontaneous ventilation  Follow-up needed: none        Perioperative Events  Perioperative Event: No  Emergency Case Activation: No

## 2017-05-04 ENCOUNTER — Ambulatory Visit: Admit: 2017-05-04 | Discharge: 2017-05-05 | Payer: MEDICARE

## 2017-05-05 ENCOUNTER — Encounter: Admit: 2017-05-05 | Discharge: 2017-05-05 | Payer: MEDICARE

## 2017-05-05 DIAGNOSIS — I491 Atrial premature depolarization: Principal | ICD-10-CM

## 2017-05-05 DIAGNOSIS — I25110 Atherosclerotic heart disease of native coronary artery with unstable angina pectoris: Principal | ICD-10-CM

## 2017-05-05 DIAGNOSIS — I2510 Atherosclerotic heart disease of native coronary artery without angina pectoris: Principal | ICD-10-CM

## 2017-05-05 DIAGNOSIS — I00 Rheumatic fever without heart involvement: ICD-10-CM

## 2017-05-05 DIAGNOSIS — E785 Hyperlipidemia, unspecified: ICD-10-CM

## 2017-05-05 DIAGNOSIS — E039 Hypothyroidism, unspecified: ICD-10-CM

## 2017-05-05 DIAGNOSIS — R001 Bradycardia, unspecified: ICD-10-CM

## 2017-05-05 DIAGNOSIS — K219 Gastro-esophageal reflux disease without esophagitis: ICD-10-CM

## 2017-05-05 DIAGNOSIS — I10 Essential (primary) hypertension: ICD-10-CM

## 2017-05-05 DIAGNOSIS — I219 Acute myocardial infarction, unspecified: ICD-10-CM

## 2017-05-08 ENCOUNTER — Encounter: Admit: 2017-05-08 | Discharge: 2017-05-08 | Payer: MEDICARE

## 2017-05-08 NOTE — Telephone Encounter
-----   Message from Nickolas Madrid, MD sent at 05/08/2017 10:03 AM CDT -----  Dear Nurses,     Please call this patient and let him know that the event monitor did show some slow heart rates, at that time patient was most likely asleep, and it is normal to have slower heart rates while asleep.  If he continues to be lightheaded and has borderline hypotension, the lisinopril can be further decreased to 10 mg p.o. daily.  I anticipate to see him back in the office in 3 months from the last office visit.    Thank you      ----- Message -----  From: Nickolas Madrid, MD  Sent: 05/06/2017   5:43 PM  To: Nickolas Madrid, MD

## 2017-05-08 NOTE — Telephone Encounter
Results called to wife.

## 2017-05-12 ENCOUNTER — Encounter: Admit: 2017-05-12 | Discharge: 2017-05-12 | Payer: MEDICARE

## 2017-05-12 DIAGNOSIS — R0602 Shortness of breath: Principal | ICD-10-CM

## 2017-05-29 ENCOUNTER — Encounter: Admit: 2017-05-29 | Discharge: 2017-05-29 | Payer: MEDICARE

## 2017-05-29 ENCOUNTER — Ambulatory Visit: Admit: 2017-05-29 | Discharge: 2017-05-29 | Payer: MEDICARE

## 2017-05-29 DIAGNOSIS — I219 Acute myocardial infarction, unspecified: ICD-10-CM

## 2017-05-29 DIAGNOSIS — E785 Hyperlipidemia, unspecified: ICD-10-CM

## 2017-05-29 DIAGNOSIS — R0683 Snoring: Principal | ICD-10-CM

## 2017-05-29 DIAGNOSIS — I491 Atrial premature depolarization: ICD-10-CM

## 2017-05-29 DIAGNOSIS — R0681 Apnea, not elsewhere classified: ICD-10-CM

## 2017-05-29 DIAGNOSIS — E039 Hypothyroidism, unspecified: ICD-10-CM

## 2017-05-29 DIAGNOSIS — I10 Essential (primary) hypertension: ICD-10-CM

## 2017-05-29 DIAGNOSIS — R0602 Shortness of breath: Principal | ICD-10-CM

## 2017-05-29 DIAGNOSIS — R05 Cough: ICD-10-CM

## 2017-05-29 DIAGNOSIS — Z720 Tobacco use: ICD-10-CM

## 2017-05-29 DIAGNOSIS — J302 Other seasonal allergic rhinitis: ICD-10-CM

## 2017-05-29 DIAGNOSIS — I00 Rheumatic fever without heart involvement: ICD-10-CM

## 2017-05-29 DIAGNOSIS — J189 Pneumonia, unspecified organism: ICD-10-CM

## 2017-05-29 DIAGNOSIS — J449 Chronic obstructive pulmonary disease, unspecified: ICD-10-CM

## 2017-05-29 DIAGNOSIS — K219 Gastro-esophageal reflux disease without esophagitis: ICD-10-CM

## 2017-05-29 DIAGNOSIS — I2510 Atherosclerotic heart disease of native coronary artery without angina pectoris: Principal | ICD-10-CM

## 2017-05-29 MED ORDER — FLUTICASONE 50 MCG/ACTUATION NA SPSN
2 | Freq: Every day | NASAL | 11 refills | 60.00000 days | Status: AC
Start: 2017-05-29 — End: 2019-05-09

## 2017-05-29 MED ORDER — TIOTROPIUM BROMIDE 2.5 MCG/ACTUATION IN MIST
2 | Freq: Every day | RESPIRATORY_TRACT | 11 refills | 25.00000 days | Status: AC
Start: 2017-05-29 — End: ?

## 2017-05-29 NOTE — Assessment & Plan Note
**   Assessment/Plan:   - Discussed side effects of Spiriva.  He was instructed on how to use the device.   - Recommended Prevnar, Pneumovax and influenza vaccinations, not interested in receiving in clinic at this time.

## 2017-05-29 NOTE — Assessment & Plan Note
**   Assessment/Plan:   - Recommended split night sleep study to further evaluate for possible OSA.  We discussed how this can impact his heart function if he does have untreated sleep apnea.

## 2017-05-29 NOTE — Assessment & Plan Note
**   Assessment/Plan:   - Commended cessation   - Discussed with the patient the recommendation for annual low dose CT imaging considering his smoking history.  We discussed advantages and disadvantages.  He will call if he would like to set this up.

## 2017-05-29 NOTE — Progress Notes
Epworth Sleepiness Scale    Sitting and reading:  2  Watching TV:  2  Sitting inactive in a public place:  1  Being a passenger in a motor vehicle for an hour or more:  1  Lying down in the afternoon:  1  Sitting and talking to someone:  1  Sitting quietly after lunch (no alcohol):  1  Stopped for a few minutes in traffic:  1  Total Score:  10  Shearon Stalls, RN

## 2017-07-26 LAB — BASIC METABOLIC PANEL
Lab: 1.1
Lab: 104
Lab: 139
Lab: 14
Lab: 19
Lab: 25
Lab: 9.7
Lab: 92

## 2017-07-26 LAB — FREE T4 (FREE THYROXINE) ONLY: Lab: 1.2

## 2017-10-03 ENCOUNTER — Encounter: Admit: 2017-10-03 | Discharge: 2017-10-03 | Payer: MEDICARE

## 2017-10-03 ENCOUNTER — Ambulatory Visit: Admit: 2017-10-03 | Discharge: 2017-10-04 | Payer: MEDICARE

## 2017-10-03 DIAGNOSIS — I2510 Atherosclerotic heart disease of native coronary artery without angina pectoris: Principal | ICD-10-CM

## 2017-10-03 DIAGNOSIS — Z87891 Personal history of nicotine dependence: ICD-10-CM

## 2017-10-03 DIAGNOSIS — J302 Other seasonal allergic rhinitis: ICD-10-CM

## 2017-10-03 DIAGNOSIS — I00 Rheumatic fever without heart involvement: ICD-10-CM

## 2017-10-03 DIAGNOSIS — I10 Essential (primary) hypertension: ICD-10-CM

## 2017-10-03 DIAGNOSIS — R0989 Other specified symptoms and signs involving the circulatory and respiratory systems: ICD-10-CM

## 2017-10-03 DIAGNOSIS — Z720 Tobacco use: ICD-10-CM

## 2017-10-03 DIAGNOSIS — I2111 ST elevation (STEMI) myocardial infarction involving right coronary artery: ICD-10-CM

## 2017-10-03 DIAGNOSIS — J189 Pneumonia, unspecified organism: ICD-10-CM

## 2017-10-03 DIAGNOSIS — E7800 Pure hypercholesterolemia, unspecified: ICD-10-CM

## 2017-10-03 DIAGNOSIS — R05 Cough: Secondary | ICD-10-CM

## 2017-10-03 DIAGNOSIS — I219 Acute myocardial infarction, unspecified: ICD-10-CM

## 2017-10-03 DIAGNOSIS — I491 Atrial premature depolarization: ICD-10-CM

## 2017-10-03 DIAGNOSIS — I471 Supraventricular tachycardia: ICD-10-CM

## 2017-10-03 DIAGNOSIS — I952 Hypotension due to drugs: ICD-10-CM

## 2017-10-03 DIAGNOSIS — K219 Gastro-esophageal reflux disease without esophagitis: ICD-10-CM

## 2017-10-03 DIAGNOSIS — E039 Hypothyroidism, unspecified: ICD-10-CM

## 2017-10-03 DIAGNOSIS — I25110 Atherosclerotic heart disease of native coronary artery with unstable angina pectoris: Principal | ICD-10-CM

## 2017-10-03 DIAGNOSIS — E785 Hyperlipidemia, unspecified: ICD-10-CM

## 2017-10-03 MED ORDER — LOSARTAN 25 MG PO TAB
25 mg | ORAL_TABLET | Freq: Every day | ORAL | 3 refills | 30.00000 days | Status: AC
Start: 2017-10-03 — End: 2018-09-05

## 2017-10-03 MED ORDER — NITROGLYCERIN 0.4 MG SL SUBL
0.4 mg | ORAL_TABLET | SUBLINGUAL | 6 refills | 9.00000 days | Status: AC | PRN
Start: 2017-10-03 — End: ?

## 2017-10-03 MED ORDER — NITROGLYCERIN 0.4 MG SL SUBL
0.4 mg | ORAL_TABLET | SUBLINGUAL | 6 refills | 9.00000 days | Status: AC | PRN
Start: 2017-10-03 — End: 2017-10-03

## 2017-10-05 ENCOUNTER — Encounter: Admit: 2017-10-05 | Discharge: 2017-10-05 | Payer: MEDICARE

## 2017-10-11 ENCOUNTER — Ambulatory Visit: Admit: 2017-10-11 | Discharge: 2017-10-12 | Payer: MEDICARE

## 2017-10-11 DIAGNOSIS — R0989 Other specified symptoms and signs involving the circulatory and respiratory systems: ICD-10-CM

## 2017-10-11 DIAGNOSIS — E7800 Pure hypercholesterolemia, unspecified: ICD-10-CM

## 2017-10-11 DIAGNOSIS — I491 Atrial premature depolarization: ICD-10-CM

## 2017-10-11 DIAGNOSIS — I25110 Atherosclerotic heart disease of native coronary artery with unstable angina pectoris: Principal | ICD-10-CM

## 2017-10-11 DIAGNOSIS — I471 Supraventricular tachycardia: ICD-10-CM

## 2017-10-11 DIAGNOSIS — Z720 Tobacco use: ICD-10-CM

## 2017-10-11 DIAGNOSIS — I10 Essential (primary) hypertension: ICD-10-CM

## 2017-10-11 DIAGNOSIS — I952 Hypotension due to drugs: ICD-10-CM

## 2017-10-11 DIAGNOSIS — R05 Cough: ICD-10-CM

## 2017-10-11 DIAGNOSIS — Z87891 Personal history of nicotine dependence: ICD-10-CM

## 2017-10-11 DIAGNOSIS — I2111 ST elevation (STEMI) myocardial infarction involving right coronary artery: ICD-10-CM

## 2017-10-17 ENCOUNTER — Encounter: Admit: 2017-10-17 | Discharge: 2017-10-17 | Payer: MEDICARE

## 2017-11-10 ENCOUNTER — Encounter: Admit: 2017-11-10 | Discharge: 2017-11-10 | Payer: MEDICARE

## 2017-11-14 MED ORDER — LEVOTHYROXINE 112 MCG PO TAB
ORAL_TABLET | Freq: Every morning | ORAL | 3 refills | 30.00000 days | Status: AC
Start: 2017-11-14 — End: 2018-11-09

## 2017-11-14 MED ORDER — ROSUVASTATIN 20 MG PO TAB
ORAL_TABLET | Freq: Every day | ORAL | 3 refills | 90.00000 days | Status: AC
Start: 2017-11-14 — End: 2018-11-09

## 2017-11-30 ENCOUNTER — Encounter: Admit: 2017-11-30 | Discharge: 2017-11-30 | Payer: MEDICARE

## 2017-11-30 DIAGNOSIS — I10 Essential (primary) hypertension: ICD-10-CM

## 2017-11-30 DIAGNOSIS — I00 Rheumatic fever without heart involvement: ICD-10-CM

## 2017-11-30 DIAGNOSIS — I2510 Atherosclerotic heart disease of native coronary artery without angina pectoris: Principal | ICD-10-CM

## 2017-11-30 DIAGNOSIS — I491 Atrial premature depolarization: ICD-10-CM

## 2017-11-30 DIAGNOSIS — E039 Hypothyroidism, unspecified: ICD-10-CM

## 2017-11-30 DIAGNOSIS — J189 Pneumonia, unspecified organism: ICD-10-CM

## 2017-11-30 DIAGNOSIS — J302 Other seasonal allergic rhinitis: ICD-10-CM

## 2017-11-30 DIAGNOSIS — E785 Hyperlipidemia, unspecified: ICD-10-CM

## 2017-11-30 DIAGNOSIS — I219 Acute myocardial infarction, unspecified: ICD-10-CM

## 2017-11-30 DIAGNOSIS — K219 Gastro-esophageal reflux disease without esophagitis: ICD-10-CM

## 2017-12-05 ENCOUNTER — Encounter: Admit: 2017-12-05 | Discharge: 2017-12-05 | Payer: MEDICARE

## 2017-12-05 MED ORDER — BRILINTA 90 MG PO TAB
ORAL_TABLET | Freq: Two times a day (BID) | 2 refills | Status: AC
Start: 2017-12-05 — End: 2017-12-06

## 2017-12-06 ENCOUNTER — Encounter: Admit: 2017-12-06 | Discharge: 2017-12-06 | Payer: MEDICARE

## 2017-12-06 MED ORDER — TICAGRELOR 90 MG PO TAB
90 mg | ORAL_TABLET | Freq: Two times a day (BID) | ORAL | 2 refills | Status: AC
Start: 2017-12-06 — End: 2018-05-24

## 2018-05-24 ENCOUNTER — Encounter: Admit: 2018-05-24 | Discharge: 2018-05-24 | Payer: MEDICARE

## 2018-05-24 ENCOUNTER — Ambulatory Visit: Admit: 2018-05-24 | Discharge: 2018-05-25 | Payer: MEDICARE

## 2018-05-24 DIAGNOSIS — Z955 Presence of coronary angioplasty implant and graft: ICD-10-CM

## 2018-05-24 DIAGNOSIS — Z87891 Personal history of nicotine dependence: ICD-10-CM

## 2018-05-24 DIAGNOSIS — I471 Supraventricular tachycardia: ICD-10-CM

## 2018-05-24 DIAGNOSIS — I2510 Atherosclerotic heart disease of native coronary artery without angina pectoris: Principal | ICD-10-CM

## 2018-05-24 DIAGNOSIS — I952 Hypotension due to drugs: ICD-10-CM

## 2018-05-24 DIAGNOSIS — I10 Essential (primary) hypertension: Principal | ICD-10-CM

## 2018-05-24 DIAGNOSIS — I00 Rheumatic fever without heart involvement: ICD-10-CM

## 2018-05-24 DIAGNOSIS — J189 Pneumonia, unspecified organism: ICD-10-CM

## 2018-05-24 DIAGNOSIS — K219 Gastro-esophageal reflux disease without esophagitis: ICD-10-CM

## 2018-05-24 DIAGNOSIS — I25110 Atherosclerotic heart disease of native coronary artery with unstable angina pectoris: ICD-10-CM

## 2018-05-24 DIAGNOSIS — I491 Atrial premature depolarization: ICD-10-CM

## 2018-05-24 DIAGNOSIS — I219 Acute myocardial infarction, unspecified: ICD-10-CM

## 2018-05-24 DIAGNOSIS — I2111 ST elevation (STEMI) myocardial infarction involving right coronary artery: ICD-10-CM

## 2018-05-24 DIAGNOSIS — R05 Cough: ICD-10-CM

## 2018-05-24 DIAGNOSIS — E039 Hypothyroidism, unspecified: ICD-10-CM

## 2018-05-24 DIAGNOSIS — E7800 Pure hypercholesterolemia, unspecified: ICD-10-CM

## 2018-05-24 DIAGNOSIS — J302 Other seasonal allergic rhinitis: ICD-10-CM

## 2018-05-24 DIAGNOSIS — E785 Hyperlipidemia, unspecified: ICD-10-CM

## 2018-05-24 DIAGNOSIS — R0989 Other specified symptoms and signs involving the circulatory and respiratory systems: ICD-10-CM

## 2018-05-30 ENCOUNTER — Encounter: Admit: 2018-05-30 | Discharge: 2018-05-30 | Payer: MEDICARE

## 2018-09-04 ENCOUNTER — Encounter: Admit: 2018-09-04 | Discharge: 2018-09-04 | Payer: MEDICARE

## 2018-09-04 DIAGNOSIS — E785 Hyperlipidemia, unspecified: Secondary | ICD-10-CM

## 2018-09-04 DIAGNOSIS — I2510 Atherosclerotic heart disease of native coronary artery without angina pectoris: Secondary | ICD-10-CM

## 2018-09-04 DIAGNOSIS — I10 Essential (primary) hypertension: Secondary | ICD-10-CM

## 2018-09-05 MED ORDER — LOSARTAN 25 MG PO TAB
ORAL_TABLET | Freq: Every day | ORAL | 0 refills | 30.00000 days | Status: AC
Start: 2018-09-05 — End: 2018-12-04

## 2018-11-08 ENCOUNTER — Encounter: Admit: 2018-11-08 | Discharge: 2018-11-08 | Payer: MEDICARE

## 2018-11-09 MED ORDER — LEVOTHYROXINE 112 MCG PO TAB
ORAL_TABLET | Freq: Every morning | ORAL | 3 refills | 30.00000 days | Status: AC
Start: 2018-11-09 — End: ?

## 2018-11-09 MED ORDER — ROSUVASTATIN 20 MG PO TAB
ORAL_TABLET | Freq: Every day | ORAL | 3 refills | 90.00000 days | Status: AC
Start: 2018-11-09 — End: ?

## 2018-12-03 ENCOUNTER — Encounter: Admit: 2018-12-03 | Discharge: 2018-12-03 | Payer: MEDICARE

## 2018-12-04 MED ORDER — LOSARTAN 25 MG PO TAB
ORAL_TABLET | Freq: Every day | ORAL | 2 refills | 30.00000 days | Status: DC
Start: 2018-12-04 — End: 2019-02-12

## 2018-12-20 ENCOUNTER — Encounter: Admit: 2018-12-20 | Discharge: 2018-12-20 | Payer: MEDICARE

## 2018-12-20 NOTE — Telephone Encounter
Contacted patient by phone on 12/20/2018 regarding changing appointment to a telehealth visit with Dr. Nickolas Madrid on 12/25/2018. Offered telehealth visit to patient. Patient does not have a home email address to activate MyChart account and does not have a smart device to access appointment. Offered patient a telephone visit with Dr. Nickolas Madrid on 12/25/2018. Patient not having any cardiac problems or concerns at this time. Patient agreed to a telephone visit with [facility] on 12/25/2018. Patient will have current blood pressure reading and current medication list ready for appointment.

## 2018-12-25 ENCOUNTER — Encounter: Admit: 2018-12-25 | Discharge: 2018-12-25 | Payer: MEDICARE

## 2018-12-25 ENCOUNTER — Ambulatory Visit: Admit: 2018-12-25 | Discharge: 2018-12-26 | Payer: MEDICARE

## 2018-12-25 DIAGNOSIS — K219 Gastro-esophageal reflux disease without esophagitis: ICD-10-CM

## 2018-12-25 DIAGNOSIS — R05 Cough: ICD-10-CM

## 2018-12-25 DIAGNOSIS — E039 Hypothyroidism, unspecified: ICD-10-CM

## 2018-12-25 DIAGNOSIS — Z955 Presence of coronary angioplasty implant and graft: ICD-10-CM

## 2018-12-25 DIAGNOSIS — W19XXXA Unspecified fall, initial encounter: ICD-10-CM

## 2018-12-25 DIAGNOSIS — I2111 ST elevation (STEMI) myocardial infarction involving right coronary artery: Principal | ICD-10-CM

## 2018-12-25 DIAGNOSIS — I952 Hypotension due to drugs: ICD-10-CM

## 2018-12-25 DIAGNOSIS — I491 Atrial premature depolarization: ICD-10-CM

## 2018-12-25 DIAGNOSIS — I00 Rheumatic fever without heart involvement: ICD-10-CM

## 2018-12-25 DIAGNOSIS — J302 Other seasonal allergic rhinitis: ICD-10-CM

## 2018-12-25 DIAGNOSIS — E785 Hyperlipidemia, unspecified: ICD-10-CM

## 2018-12-25 DIAGNOSIS — I219 Acute myocardial infarction, unspecified: ICD-10-CM

## 2018-12-25 DIAGNOSIS — I2510 Atherosclerotic heart disease of native coronary artery without angina pectoris: Principal | ICD-10-CM

## 2018-12-25 DIAGNOSIS — I10 Essential (primary) hypertension: ICD-10-CM

## 2018-12-25 DIAGNOSIS — I6523 Occlusion and stenosis of bilateral carotid arteries: ICD-10-CM

## 2018-12-25 DIAGNOSIS — J189 Pneumonia, unspecified organism: ICD-10-CM

## 2018-12-25 NOTE — Telephone Encounter
Called to patient. 

## 2018-12-26 NOTE — Progress Notes
Serial number: [id number]

## 2019-01-02 ENCOUNTER — Ambulatory Visit: Admit: 2019-01-02 | Discharge: 2019-01-02 | Payer: MEDICARE

## 2019-01-02 ENCOUNTER — Encounter: Admit: 2019-01-02 | Discharge: 2019-01-02 | Payer: MEDICARE

## 2019-01-02 DIAGNOSIS — I2510 Atherosclerotic heart disease of native coronary artery without angina pectoris: ICD-10-CM

## 2019-01-02 DIAGNOSIS — I2111 ST elevation (STEMI) myocardial infarction involving right coronary artery: Principal | ICD-10-CM

## 2019-01-02 DIAGNOSIS — I491 Atrial premature depolarization: ICD-10-CM

## 2019-01-02 DIAGNOSIS — Z955 Presence of coronary angioplasty implant and graft: ICD-10-CM

## 2019-01-02 DIAGNOSIS — I952 Hypotension due to drugs: Secondary | ICD-10-CM

## 2019-01-02 DIAGNOSIS — I10 Essential (primary) hypertension: ICD-10-CM

## 2019-01-02 DIAGNOSIS — R05 Cough: ICD-10-CM

## 2019-01-02 DIAGNOSIS — I6523 Occlusion and stenosis of bilateral carotid arteries: ICD-10-CM

## 2019-01-02 DIAGNOSIS — E785 Hyperlipidemia, unspecified: ICD-10-CM

## 2019-01-02 DIAGNOSIS — T464X5A Adverse effect of angiotensin-converting-enzyme inhibitors, initial encounter: ICD-10-CM

## 2019-01-03 ENCOUNTER — Encounter: Admit: 2019-01-03 | Discharge: 2019-01-03 | Payer: MEDICARE

## 2019-01-03 NOTE — Telephone Encounter
Called to patient's wife

## 2019-01-04 ENCOUNTER — Ambulatory Visit: Admit: 2019-01-04 | Discharge: 2019-01-04 | Payer: MEDICARE

## 2019-01-04 ENCOUNTER — Encounter: Admit: 2019-01-04 | Discharge: 2019-01-04 | Payer: MEDICARE

## 2019-01-04 DIAGNOSIS — I952 Hypotension due to drugs: ICD-10-CM

## 2019-01-04 DIAGNOSIS — R05 Cough: ICD-10-CM

## 2019-01-04 DIAGNOSIS — E785 Hyperlipidemia, unspecified: ICD-10-CM

## 2019-01-04 DIAGNOSIS — I10 Essential (primary) hypertension: ICD-10-CM

## 2019-01-04 DIAGNOSIS — Z955 Presence of coronary angioplasty implant and graft: ICD-10-CM

## 2019-01-04 DIAGNOSIS — I491 Atrial premature depolarization: ICD-10-CM

## 2019-01-04 DIAGNOSIS — I6523 Occlusion and stenosis of bilateral carotid arteries: ICD-10-CM

## 2019-01-04 DIAGNOSIS — I2510 Atherosclerotic heart disease of native coronary artery without angina pectoris: ICD-10-CM

## 2019-01-04 DIAGNOSIS — W19XXXA Unspecified fall, initial encounter: ICD-10-CM

## 2019-01-04 DIAGNOSIS — I2111 ST elevation (STEMI) myocardial infarction involving right coronary artery: Principal | ICD-10-CM

## 2019-01-07 ENCOUNTER — Encounter: Admit: 2019-01-07 | Discharge: 2019-01-07 | Payer: MEDICARE

## 2019-01-07 NOTE — Telephone Encounter
-----   Message from Dorris Fetch, MD sent at 01/04/2019  5:13 PM CDT -----  Dear Ophelia Charter call Mr. 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  He can come back and see me in the office in 6 months from the last office visit, he was last evaluated in September 2019.Thank you  ----- Message -----  From: Lucianne Muss, MD  Sent: 01/04/2019   4:08 PM CDT  To: Dorris Fetch, MD

## 2019-01-07 NOTE — Telephone Encounter
Called and discussed results with pt's spouse.  Scheduled pt for follow up with Dr. Avie Arenas in [place].  Pt's spouse confirmed appointment time and location.

## 2019-01-23 ENCOUNTER — Encounter: Admit: 2019-01-23 | Discharge: 2019-01-23 | Payer: MEDICARE

## 2019-01-23 NOTE — Telephone Encounter
-----   Message from Dorris Fetch, MD sent at 01/22/2019  9:51 PM CDT -----  Dear Orma Flaming. Doubek has an abnormal Holter monitor.  He needs further EP evaluation.  Please arrange this ASAP.Thank you  ----- Message -----  From: Dorris Fetch, MD  Sent: 01/22/2019   9:49 PM CDT  To: Dorris Fetch, MD

## 2019-01-26 ENCOUNTER — Encounter: Admit: 2019-01-26 | Discharge: 2019-01-26 | Payer: MEDICARE

## 2019-01-28 ENCOUNTER — Ambulatory Visit: Admit: 2019-01-28 | Discharge: 2019-01-29

## 2019-01-28 ENCOUNTER — Encounter: Admit: 2019-01-28 | Discharge: 2019-01-28

## 2019-01-28 DIAGNOSIS — I2510 Atherosclerotic heart disease of native coronary artery without angina pectoris: Secondary | ICD-10-CM

## 2019-01-28 DIAGNOSIS — I00 Rheumatic fever without heart involvement: Secondary | ICD-10-CM

## 2019-01-28 DIAGNOSIS — I219 Acute myocardial infarction, unspecified: Secondary | ICD-10-CM

## 2019-01-28 DIAGNOSIS — R001 Bradycardia, unspecified: Secondary | ICD-10-CM

## 2019-01-28 DIAGNOSIS — J302 Other seasonal allergic rhinitis: Secondary | ICD-10-CM

## 2019-01-28 DIAGNOSIS — J189 Pneumonia, unspecified organism: Secondary | ICD-10-CM

## 2019-01-28 DIAGNOSIS — I10 Essential (primary) hypertension: Secondary | ICD-10-CM

## 2019-01-28 DIAGNOSIS — I491 Atrial premature depolarization: Secondary | ICD-10-CM

## 2019-01-28 DIAGNOSIS — E039 Hypothyroidism, unspecified: Secondary | ICD-10-CM

## 2019-01-28 DIAGNOSIS — E785 Hyperlipidemia, unspecified: Secondary | ICD-10-CM

## 2019-01-28 DIAGNOSIS — K219 Gastro-esophageal reflux disease without esophagitis: Secondary | ICD-10-CM

## 2019-01-28 MED ORDER — EUCALYPTUS-MENTHOL MM LOZG
1 | Freq: Once | ORAL | 0 refills | Status: AC | PRN
Start: 2019-01-28 — End: ?

## 2019-01-28 MED ORDER — ALBUTEROL SULFATE 90 MCG/ACTUATION IN HFAA
2 | RESPIRATORY_TRACT | 0 refills | Status: DC | PRN
Start: 2019-01-28 — End: 2019-02-02

## 2019-01-28 MED ORDER — REGADENOSON 0.4 MG/5 ML IV SYRG
.4 mg | Freq: Once | INTRAVENOUS | 0 refills | Status: CP
Start: 2019-01-28 — End: ?

## 2019-01-28 MED ORDER — NITROGLYCERIN 0.4 MG SL SUBL
.4 mg | SUBLINGUAL | 0 refills | Status: DC | PRN
Start: 2019-01-28 — End: 2019-02-02

## 2019-01-28 MED ORDER — SODIUM CHLORIDE 0.9 % IV SOLP
250 mL | INTRAVENOUS | 0 refills | Status: AC | PRN
Start: 2019-01-28 — End: ?

## 2019-01-28 MED ORDER — AMINOPHYLLINE 500 MG/20 ML IV SOLN
50 mg | INTRAVENOUS | 0 refills | Status: AC | PRN
Start: 2019-01-28 — End: ?

## 2019-02-11 ENCOUNTER — Encounter: Admit: 2019-02-11 | Discharge: 2019-02-11

## 2019-02-11 NOTE — Telephone Encounter
02/11/19 spoke with pt wife to verify if pt has seen anyone other Dr. Since last seen by the cardiology dept. (PCP or any physician or facility) Pt wife stated no they have no been seen by pcp or anywhere else.   No records to request edh

## 2019-02-12 ENCOUNTER — Encounter: Admit: 2019-02-12 | Discharge: 2019-02-12

## 2019-02-12 ENCOUNTER — Ambulatory Visit: Admit: 2019-02-12 | Discharge: 2019-02-13

## 2019-02-12 DIAGNOSIS — I471 Supraventricular tachycardia: Secondary | ICD-10-CM

## 2019-02-12 DIAGNOSIS — K219 Gastro-esophageal reflux disease without esophagitis: Secondary | ICD-10-CM

## 2019-02-12 DIAGNOSIS — I10 Essential (primary) hypertension: Secondary | ICD-10-CM

## 2019-02-12 DIAGNOSIS — J449 Chronic obstructive pulmonary disease, unspecified: Secondary | ICD-10-CM

## 2019-02-12 DIAGNOSIS — I2510 Atherosclerotic heart disease of native coronary artery without angina pectoris: Secondary | ICD-10-CM

## 2019-02-12 DIAGNOSIS — E039 Hypothyroidism, unspecified: Secondary | ICD-10-CM

## 2019-02-12 DIAGNOSIS — J302 Other seasonal allergic rhinitis: Secondary | ICD-10-CM

## 2019-02-12 DIAGNOSIS — G4730 Sleep apnea, unspecified: Secondary | ICD-10-CM

## 2019-02-12 DIAGNOSIS — I219 Acute myocardial infarction, unspecified: Secondary | ICD-10-CM

## 2019-02-12 DIAGNOSIS — R001 Bradycardia, unspecified: Secondary | ICD-10-CM

## 2019-02-12 DIAGNOSIS — J189 Pneumonia, unspecified organism: Secondary | ICD-10-CM

## 2019-02-12 DIAGNOSIS — I00 Rheumatic fever without heart involvement: Secondary | ICD-10-CM

## 2019-02-12 DIAGNOSIS — E785 Hyperlipidemia, unspecified: Secondary | ICD-10-CM

## 2019-02-12 DIAGNOSIS — I491 Atrial premature depolarization: Secondary | ICD-10-CM

## 2019-02-12 DIAGNOSIS — R0683 Snoring: Secondary | ICD-10-CM

## 2019-02-12 MED ORDER — LOSARTAN 25 MG PO TAB
25 mg | ORAL_TABLET | Freq: Every day | ORAL | 3 refills | 30.00000 days | Status: DC
Start: 2019-02-12 — End: 2020-02-07

## 2019-02-12 NOTE — Progress Notes
Date of Service: 02/12/2019    Jack Wagner is a 78 y.o. male.       HPI     Dorinda Hill is here today for a follow-up office visit.  He was seen by me in the office on January 30, 2019.  At that time patient reported possible syncopal episodes after falling from a ladder, also concerns for bradycardia.    Patient was evaluated with the following test:    1.  Long-term Holter monitor??? the average heart rate was 55 bpm, patient did have SVT episodes, the longest lasted 22.2 seconds and this was followed by a conversion pause, a junctional rhythm did occur in can junction with this episode, overall it was low burden of ventricular ectopy and no evidence of ventricular tachycardia.  2.  Carotid artery duplex dated 01/04/2019??? demonstrated mild to moderate atheromatous and calcific plaque visualized in bilateral common and internal carotid arteries with velocities suggestive of moderate stenosis in the mid right ICA, however significant tortuosity was present and this could be responsible for the elevated velocities.  3.  Myocardial pressure imaging study performed on January 28, 2019???the perfusion pattern was negative for ischemia and ejection fraction was 68%.    Patient was evaluated in the office by Dr. Doyce Loose, the results of the Holter monitor were reviewed, it was interpreted that Mr. 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    Patient is here today to discuss Dr. Jasper Loser recommendation and further options.    He does not report any further symptoms of presyncope or syncope.  Patient also thinks that at that time the junctional rhythm and a conversion pause were recorded on the Holter monitor he was probably asleep in his chair waiting for the evening meals to start.  He is not certain, however he does think that he might have sleep apnea, he has never been tested for this.  Patient does not want to undergo a device implant at this point in time.       Vitals:    02/12/19 1259 02/12/19 1309   BP: 130/78 132/80   BP Source: Arm, Left Upper Arm, Right Upper   Pulse: 60    SpO2: 98%    Weight: 82.6 kg (182 lb)    Height: 1.829 m (6')    PainSc: Zero      Body mass index is 24.68 kg/m???.     Past Medical History  Patient Active Problem List    Diagnosis Date Noted   ??? Fall 12/25/2018   ??? Bilateral carotid artery stenosis 12/25/2018   ??? Stented coronary artery 05/24/2018   ??? Cough 10/03/2017   ??? ACE-inhibitor cough 10/03/2017   ??? Snoring 05/29/2017     - Witnessed apneas and snoring  - ESS 10     ??? COPD (chronic obstructive pulmonary disease) (HCC) 05/29/2017     GOLD Staging: A1  mMRC: 1  GOLD classification of airflow obstruction: 1  Number exacerbations in past year: 0  PFT (05/29/2017)  FVC 4.49L (101% pred)  FEV1 2.65L (82% pred)  FEV1/FVC 59%  RV 2.98L (114% pred)  DLCO 94% pred  Inhaler Regimen  Spiriva 2 puffs once a day  Vaccinations  Influenza - Refused  Pneumococcal - Will need to check with PCP to ensure this has occured  Prevnar - Will need to check with PCP to ensure this has occured  Pulm Rehab   Currently  performing cardiac rehab  Smoking  40 pk year, quit 10/2016  LDCT screening  Recommended to patient, he would like to consider and discuss further at upcoming appointment     ??? Dysgeusia 04/12/2017     Added automatically from request for surgery [id number]     ??? Weight loss 03/30/2017   ??? Hypotension due to drugs 03/30/2017   ??? BRBPR (bright red blood per rectum) 02/23/2017     Added automatically from request for surgery [id number]     ??? Colon cancer screening 02/23/2017     Added automatically from request for surgery [id number]     ??? History of anemia 02/23/2017     Added automatically from request for surgery [phone]     ??? Tobacco abuse 02/23/2017     - At least 50 pk years, quit 10/2016     ??? Dysphagia 02/23/2017     Added automatically from request for surgery [phone] ??? Gastroesophageal reflux disease with esophagitis 02/22/2017   ??? 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 Bad breath 02/22/2017   ??? Bright red rectal bleeding 02/22/2017   ??? Other dysphagia 02/22/2017   ??? Prediabetes 11/25/2016   ??? Atrial tachycardia (HCC) 11/24/2016   ??? 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 Bradycardia 05/16/2013   ??? Right carotid bruit 05/16/2013   ??? Dyspnea 03/01/2011   ??? History of tobacco use 03/01/2011   ??? PAC (premature atrial contraction) 04/08/2010   ??? Abnormal cardiovascular stress test 11/12/2008   ??? CAD (coronary artery disease) 11/12/2008     11/12/08: Cath - Ostial left main stenosis of 20-30%. Proximal LAD stenosis of 30-40%. Diagonal ostial stenosis of 60%. Mid circumflex with 30-40% stenosis. Mid RCA with a 30-40% stenosis. Medical management.    11/23/16: STEMI/Cath - Culprit lesion, subtotal 99% proximal RCA acute thrombotic lesion s/p PCI with a 3.5 x 33 mm Xience Alpine DES. Ostial 20-30% left main lesion, which appears angiographically unchanged compared to the previous films.       ??? HTN (hypertension) 11/12/2008   ??? HLD (hyperlipidemia) 11/12/2008   ??? Hypothyroidism 11/12/2008         Review of Systems   Constitution: Negative.   HENT: Negative.    Eyes: Negative.    Cardiovascular: Negative.    Respiratory: Negative.    Endocrine: Negative.    Hematologic/Lymphatic: Negative.    Skin: Negative.    Musculoskeletal: Negative.    Gastrointestinal: Negative.    Genitourinary: Negative. Neurological: Negative.    Psychiatric/Behavioral: Negative.    Allergic/Immunologic: Negative.        Physical Exam  General Appearance: normal in appearance  Skin: warm, moist, no ulcers or xanthomas  Eyes: conjunctivae and lids normal, pupils are equal and round  Lips & Oral Mucosa: no pallor or cyanosis  Neck Veins: neck veins are flat, neck veins are not distended  Chest Inspection: chest is normal  in appearance  Respiratory Effort: breathing comfortably, no respiratory distress  Auscultation/Percussion: lungs clear to auscultation, no rales or rhonchi, no wheezing  Cardiac Rhythm: regular rhythm and normal rate  Cardiac Auscultation: S1, S2 normal, no rub, no gallop  Murmurs: no murmur  Carotid Arteries: normal carotid upstroke bilaterally, no bruit  Lower Extremity Edema: no lower extremity edema  Abdominal Exam: soft, non-tender, no masses, bowel sounds normal  Liver & Spleen: no organomegaly  Language and Memory: patient responsive and seems to comprehend information  Neurologic Exam: neurological assessment grossly intact      Cardiovascular Studies      Problems Addressed Today  Encounter Diagnoses   Name Primary?   ??? Hypertension, unspecified type Yes   ??? Coronary artery disease involving native coronary artery of native heart without angina pectoris    ??? Atrial tachycardia (HCC)    ??? Chronic obstructive pulmonary disease, unspecified COPD type (HCC)    ??? Bradycardia    ??? Snoring    ??? Sleep apnea, unspecified type        Assessment and Plan     In summary: This is a 78 year old white male that was found to have SVT, conversion pause and junctional rhythm on a recent Holter monitor, he is also known to have stable CAD without any recurrent symptoms of angina, patient does have nonobstructive carotid artery disease, he did undergo previous PCI of the RCA in March 2018 that occurred in the setting of a inferior wall ST elevation MI.  It also possible that he does have undiagnosed sleep apnea. Patient also has memory recall issues.    Plan:    1.  I did not make any changes in his medication  2.  I recommend to check TSH and free T4 and adjust the start replacement therapy if necessary  3.  Patient will be evaluated with a sleep study  4.  Follow-up office visit in 2 months  5.  Patient will continue to monitor his symptoms, we will continue conservative management, he will not proceed with device implant at present time.         Current Medications (including today's revisions)  ??? Aspirin 81 mg Tab Take 1 Tab by mouth Daily.   ??? Calcium Carbonate (CORAL CALCIUM) 1,000 mg tab Take 1 tablet by mouth daily.   ??? fluticasone (FLONASE) 50 mcg/actuation nasal spray Apply two sprays to each nostril as directed daily. Shake bottle gently before using. (Patient taking differently: Apply 2 sprays to each nostril as directed as Needed. Shake bottle gently before using.)   ??? glucosamine HCl/chondroitin su (GLUCOSAMINE-CHONDROITIN PO) Take 1 tablet by mouth twice daily.   ??? krill-om-3-dha-epa-phospho-ast (MEGARED OMEGA-3 KRILL OIL) 300-90-24-50 mg cap Take 1 capsule by mouth daily.   ??? lansoprazole(+) DR (PREVACID) 15 mg capsule Take 15 mg by mouth daily 30 minutes before breakfast.   ??? levothyroxine (SYNTHROID) 112 mcg tablet TAKE 1 TABLET DAILY 30 MINUTES BEFORE BREAKFAST   ??? losartan (COZAAR) 25 mg tablet TAKE 1 TABLET DAILY   ??? nitroglycerin (NITROSTAT) 0.4 mg tablet Place one tablet under tongue every 5 minutes as needed for Chest Pain.   ??? Potassium Gluconate 595 mg (99 mg) tab Take 1 tablet by mouth daily.   ??? rosuvastatin (CRESTOR) 20 mg tablet TAKE 1 TABLET DAILY   ??? selenium 200 mcg cap Take 1 capsule by mouth daily.   ??? sertraline (ZOLOFT) 50 mg tablet Take 25 mg by mouth daily.   ??? tiotropium bromide (  SPIRIVA RESPIMAT) 2.5 mcg/actuation inhaler Inhale two puffs by mouth into the lungs daily.

## 2019-03-06 DIAGNOSIS — R0683 Snoring: Secondary | ICD-10-CM

## 2019-03-06 DIAGNOSIS — G4733 Obstructive sleep apnea (adult) (pediatric): Secondary | ICD-10-CM

## 2019-03-06 DIAGNOSIS — R001 Bradycardia, unspecified: Secondary | ICD-10-CM

## 2019-03-06 DIAGNOSIS — I10 Essential (primary) hypertension: Secondary | ICD-10-CM

## 2019-03-07 ENCOUNTER — Ambulatory Visit: Admit: 2019-03-06 | Discharge: 2019-03-07

## 2019-03-07 DIAGNOSIS — J449 Chronic obstructive pulmonary disease, unspecified: Secondary | ICD-10-CM

## 2019-03-07 DIAGNOSIS — I471 Supraventricular tachycardia: Secondary | ICD-10-CM

## 2019-03-07 DIAGNOSIS — G4730 Sleep apnea, unspecified: Secondary | ICD-10-CM

## 2019-03-07 DIAGNOSIS — I2510 Atherosclerotic heart disease of native coronary artery without angina pectoris: Secondary | ICD-10-CM

## 2019-03-12 ENCOUNTER — Encounter: Admit: 2019-03-12 | Discharge: 2019-03-12

## 2019-03-27 ENCOUNTER — Ambulatory Visit: Admit: 2019-03-27 | Discharge: 2019-03-27

## 2019-04-22 ENCOUNTER — Encounter: Admit: 2019-04-22 | Discharge: 2019-04-22

## 2019-04-22 DIAGNOSIS — G4733 Obstructive sleep apnea (adult) (pediatric): Secondary | ICD-10-CM

## 2019-04-22 NOTE — Progress Notes
Pt scheduled to be seen in CPAP set up clinic. Orders placed in preparation of appointment.

## 2019-04-30 ENCOUNTER — Encounter: Admit: 2019-04-30 | Discharge: 2019-04-30

## 2019-04-30 ENCOUNTER — Ambulatory Visit: Admit: 2019-04-30 | Discharge: 2019-05-01

## 2019-04-30 DIAGNOSIS — R9431 Abnormal electrocardiogram [ECG] [EKG]: Secondary | ICD-10-CM

## 2019-04-30 DIAGNOSIS — K219 Gastro-esophageal reflux disease without esophagitis: Secondary | ICD-10-CM

## 2019-04-30 DIAGNOSIS — I219 Acute myocardial infarction, unspecified: Secondary | ICD-10-CM

## 2019-04-30 DIAGNOSIS — E039 Hypothyroidism, unspecified: Secondary | ICD-10-CM

## 2019-04-30 DIAGNOSIS — J189 Pneumonia, unspecified organism: Secondary | ICD-10-CM

## 2019-04-30 DIAGNOSIS — I2510 Atherosclerotic heart disease of native coronary artery without angina pectoris: Secondary | ICD-10-CM

## 2019-04-30 DIAGNOSIS — I10 Essential (primary) hypertension: Secondary | ICD-10-CM

## 2019-04-30 DIAGNOSIS — I00 Rheumatic fever without heart involvement: Secondary | ICD-10-CM

## 2019-04-30 DIAGNOSIS — J302 Other seasonal allergic rhinitis: Secondary | ICD-10-CM

## 2019-04-30 DIAGNOSIS — R05 Cough: Secondary | ICD-10-CM

## 2019-04-30 DIAGNOSIS — E785 Hyperlipidemia, unspecified: Secondary | ICD-10-CM

## 2019-04-30 DIAGNOSIS — I6523 Occlusion and stenosis of bilateral carotid arteries: Secondary | ICD-10-CM

## 2019-04-30 DIAGNOSIS — I491 Atrial premature depolarization: Secondary | ICD-10-CM

## 2019-04-30 MED ORDER — TADALAFIL 10 MG PO TAB
10 mg | ORAL_TABLET | ORAL | 3 refills | Status: DC | PRN
Start: 2019-04-30 — End: 2019-08-27

## 2019-05-09 ENCOUNTER — Encounter: Admit: 2019-05-09 | Discharge: 2019-05-09

## 2019-05-09 ENCOUNTER — Ambulatory Visit: Admit: 2019-05-09 | Discharge: 2019-05-10

## 2019-05-09 DIAGNOSIS — I00 Rheumatic fever without heart involvement: Secondary | ICD-10-CM

## 2019-05-09 DIAGNOSIS — I491 Atrial premature depolarization: Secondary | ICD-10-CM

## 2019-05-09 DIAGNOSIS — E039 Hypothyroidism, unspecified: Secondary | ICD-10-CM

## 2019-05-09 DIAGNOSIS — K219 Gastro-esophageal reflux disease without esophagitis: Secondary | ICD-10-CM

## 2019-05-09 DIAGNOSIS — J189 Pneumonia, unspecified organism: Secondary | ICD-10-CM

## 2019-05-09 DIAGNOSIS — I10 Essential (primary) hypertension: Secondary | ICD-10-CM

## 2019-05-09 DIAGNOSIS — I219 Acute myocardial infarction, unspecified: Secondary | ICD-10-CM

## 2019-05-09 DIAGNOSIS — J302 Other seasonal allergic rhinitis: Secondary | ICD-10-CM

## 2019-05-09 DIAGNOSIS — I2510 Atherosclerotic heart disease of native coronary artery without angina pectoris: Secondary | ICD-10-CM

## 2019-05-09 DIAGNOSIS — E785 Hyperlipidemia, unspecified: Secondary | ICD-10-CM

## 2019-05-09 MED ORDER — ALBUTEROL SULFATE 90 MCG/ACTUATION IN HFAA
2 | RESPIRATORY_TRACT | 3 refills | Status: AC | PRN
Start: 2019-05-09 — End: ?

## 2019-05-09 MED ORDER — FLUTICASONE PROPIONATE 50 MCG/ACTUATION NA SPSN
2 | Freq: Every day | NASAL | 3 refills | 60.00000 days | Status: DC
Start: 2019-05-09 — End: 2019-08-27

## 2019-05-09 NOTE — Progress Notes
Subjective:       History of Present Illness  Jack Wagner is a 78 y.o. male with past medical history of diabetes, gastroesophageal reflux disease, tobacco abuse, COPD and hyperlipidemia he is here to review his sleep study results.  He has history of snoring or witnessed apneas for several years.  He denies any gasping episodes or choking episodes at night.  He does not feel refreshed when he wakes up in the morning.  He does not get any headaches in the morning.  He does not have any trouble falling asleep or trouble staying asleep.  He does not have a set bedtime or wake up time.  He does not use any sleep aids.  He does not nap during daytime.  He thinks he gets like 5 to 8 hours of sleep per night.  He sometimes feels sleepy and tired during daytime.  He smokes 1 to 2 cigars/day and has been smoking for the past 1 to 2 years.  He used to smoke cigarettes before and he has a 60-pack-year smoking history.  He drinks alcohol rarely.  He has shortness of breath mostly with exertion.  He has cough but occasionally.  He said he is supposed to use inhaler but not been using it regularly.  He denies any history of depression or anxiety.  He has history of chronic nasal congestion for which he is currently not using anything.       Review of Systems    All ten systems were reviewed and negative except for those in HPI.    Objective:         ??? albuterol sulfate (PROAIR HFA) 90 mcg/actuation aerosol inhaler Inhale two puffs by mouth into the lungs every 6 hours as needed for Wheezing or Shortness of Breath. Shake well before use.   ??? ascorbic acid (VITAMIN C) 500 mg tablet Take 500 mg by mouth daily.   ??? Aspirin 81 mg Tab Take 1 Tab by mouth Daily.   ??? Calcium Carbonate (CORAL CALCIUM) 1,000 mg tab Take 1 tablet by mouth daily.   ??? cholecalciferol (VITAMIN D-3) 50 mcg (2,000 unit) tablet Take 2,000 Units by mouth daily.   ??? cyanocobalamin (VITAMIN B-12) 1,000 mcg tablet Take 1,000 mcg by mouth daily. ??? fluticasone propionate (FLONASE) 50 mcg/actuation nasal spray, suspension Apply two sprays to each nostril as directed daily. Shake bottle gently before using.   ??? folic acid/multivit-min/lutein (CENTRUM SILVER PO) Take 1 tablet by mouth daily.   ??? Ginkgo Biloba 60 mg cap Take 2 capsules by mouth daily.   ??? glucosamine HCl/chondroitin su (GLUCOSAMINE-CHONDROITIN PO) Take 1 tablet by mouth twice daily.   ??? krill-om-3-dha-epa-phospho-ast (MEGARED OMEGA-3 KRILL OIL) 300-90-24-50 mg cap Take 1 capsule by mouth daily.   ??? lansoprazole(+) DR (PREVACID) 15 mg capsule Take 15 mg by mouth daily 30 minutes before breakfast.   ??? levothyroxine (SYNTHROID) 112 mcg tablet TAKE 1 TABLET DAILY 30 MINUTES BEFORE BREAKFAST   ??? losartan (COZAAR) 25 mg tablet Take one tablet by mouth daily.   ??? nitroglycerin (NITROSTAT) 0.4 mg tablet Place one tablet under tongue every 5 minutes as needed for Chest Pain.   ??? Potassium Gluconate 595 mg (99 mg) tab Take 1 tablet by mouth daily.   ??? pyridoxine (vitamin B6) (VITAMIN B-6) 100 mg tablet Take 100 mg by mouth daily.   ??? rosuvastatin (CRESTOR) 20 mg tablet TAKE 1 TABLET DAILY   ??? selenium 200 mcg cap Take 1 capsule by mouth daily.   ???  sertraline (ZOLOFT) 50 mg tablet Take 25 mg by mouth daily.   ??? tadalafiL (CIALIS) 10 mg tablet Take one tablet by mouth as Needed for Erectile dysfunction.   ??? tiotropium bromide (SPIRIVA RESPIMAT) 2.5 mcg/actuation inhaler Inhale two puffs by mouth into the lungs daily.     Vitals:    05/09/19 1344   BP: 125/64   BP Source: Arm, Left Upper   Patient Position: Sitting   Pulse: 52   Resp: 16   Temp: 36.6 ???C (97.9 ???F)   TempSrc: Oral   SpO2: 100%   Weight: 83.6 kg (184 lb 3.2 oz)   Height: 182.9 cm (72.01)   PainSc: Zero     Body mass index is 24.98 kg/m???.     Physical Exam        Constitutional: He is oriented to person, place, and time. He appears well-developed.   HENT:   Head: Normocephalic and atraumatic. Eyes: Conjunctivae and EOM are normal. Pupils are equal, round, and reactive to light.   Neck: Normal range of motion. Neck supple.   Cardiovascular: Normal rate, regular rhythm, normal heart sounds and intact distal pulses.    Pulmonary/Chest: Effort normal and breath sounds normal.   Abdominal: Soft. Bowel sounds are normal.   Musculoskeletal: Normal range of motion.   Neurological: He is alert and oriented to person, place, and time. He has normal reflexes.   Skin: Skin is warm and dry.   Psychiatric: He has a normal mood and affect.   Nursing note and vitals reviewed.         Assessment and Plan:    Problem   Osa (Obstructive Sleep Apnea)    Home sleep study done on 72,020 showed moderate sleep apnea with AHI of 22.7.  DME-[facility]     Copd (Chronic Obstructive Pulmonary Disease) (Hcc)    GOLD Staging: A1  mMRC: 1  GOLD classification of airflow obstruction: 1  Number exacerbations in past year: 0  PFT (05/29/2017)  FVC 4.49L (101% pred)  FEV1 2.65L (82% pred)  FEV1/FVC 59%  RV 2.98L (114% pred)  DLCO 94% pred  Inhaler Regimen  Spiriva 2 puffs once a day  Vaccinations  Influenza - Refused  Pneumococcal - Will need to check with PCP to ensure this has occured  Prevnar - Will need to check with PCP to ensure this has occured  Pulm Rehab   Currently performing cardiac rehab  Smoking  40 pk year, quit 10/2016  LDCT screening  Recommended to patient, he would like to consider and discuss further at upcoming appointment         OSA (obstructive sleep apnea)  I discussed the sleep study results in detail and answered all the questions to patient's satisfaction. I discussed the pathophysiology of sleep apnea and the treatment options which include CPAPor oral appliance or surgery, all in conjunction with weight loss.  I discussed with the patient the consequences of untreated sleep apnea which include HTN, CVA, arrhythmias and CAD. Patient expressed interest in using CPAP. I will start him on auto CPAP 5-15 cmH2O. Discussed with him about meeting compliance for insurance purpose.      I discussed with him regarding the maintenance of the machine. I advised him to get a new mask every 3-6 months, a new hose once a year and change filters once a month.    I advised him to avoid sedatives and alcohol. I also advised him to be cautious while operating a motor vehicle when  feeling sleepy.    The patient has Medicare/Medicaid and will be required to meet the medicare/medicaid compliance guidelines, at least 4 hours of use, 70% of the time over a period of 30 days within the first 90 days of therapy.  If these guidelines are not met within 90 days a repeat sleep study and appointment will be required to re-attempt PAP therapy. The patient must also return for a follow up appointment to assess usage and benefit of therapy between 30-90 days from the day they receive their machine.        I educated him about the importance of wearing the CPAP every night.     COPD (chronic obstructive pulmonary disease) (HCC)  PFT's were done in 2018. We will get PFT's now and low dose lung CT for lung cancer screening. Currently not using any inhalers. Advised him to start using Spiriva. Given albuterol inhaler to use as needed. Advised him to get influenza vaccination when we start giving it.       RTC in 3 months.

## 2019-05-09 NOTE — Assessment & Plan Note
I discussed the sleep study results in detail and answered all the questions to patient's satisfaction. I discussed the pathophysiology of sleep apnea and the treatment options which include CPAPor oral appliance or surgery, all in conjunction with weight loss.  I discussed with the patient the consequences of untreated sleep apnea which include HTN, CVA, arrhythmias and CAD. Patient expressed interest in using CPAP. I will start him on auto CPAP 5-15 cmH2O. Discussed with him about meeting compliance for insurance purpose.      I discussed with him regarding the maintenance of the machine. I advised him to get a new mask every 3-6 months, a new hose once a year and change filters once a month.    I advised him to avoid sedatives and alcohol. I also advised him to be cautious while operating a motor vehicle when feeling sleepy.    The patient has Medicare/Medicaid and will be required to meet the medicare/medicaid compliance guidelines, at least 4 hours of use, 70% of the time over a period of 30 days within the first 90 days of therapy.  If these guidelines are not met within 90 days a repeat sleep study and appointment will be required to re-attempt PAP therapy. The patient must also return for a follow up appointment to assess usage and benefit of therapy between 30-90 days from the day they receive their machine.        I educated him about the importance of wearing the CPAP every night.

## 2019-05-10 DIAGNOSIS — J439 Emphysema, unspecified: Secondary | ICD-10-CM

## 2019-05-10 DIAGNOSIS — G4733 Obstructive sleep apnea (adult) (pediatric): Secondary | ICD-10-CM

## 2019-05-13 ENCOUNTER — Encounter: Admit: 2019-05-13 | Discharge: 2019-05-13 | Payer: MEDICARE

## 2019-05-13 NOTE — Telephone Encounter
Pt set up with CPAP @ 5-15cm H2O on 05/09/19.  DME: [facility]  Insurance: Medicare  If the patient has to meet compliance, their compliance window will be from 06/09/19 to 08/07/19.     Confirmed pt registered online in [employer]    I will have our schedulers contact the patient to make any necessary appointments.

## 2019-05-16 ENCOUNTER — Encounter: Admit: 2019-05-16 | Discharge: 2019-05-16 | Payer: MEDICARE

## 2019-05-16 NOTE — Telephone Encounter
Received request from DME for provider to sign form necessary for billing. Form completed and given to provider for signature.    Form will be faxed to [facility] and a copy scanned to pt's chart.

## 2019-06-04 ENCOUNTER — Encounter: Admit: 2019-06-04 | Discharge: 2019-06-04 | Payer: MEDICARE

## 2019-06-04 NOTE — Telephone Encounter
Recieved call requesting CMN to be signed and returned. No pending CMNs. Last one signed and sent back last month. RN refaxed that CMN.

## 2019-06-28 ENCOUNTER — Encounter: Admit: 2019-06-28 | Discharge: 2019-06-28 | Payer: MEDICARE

## 2019-06-28 NOTE — Telephone Encounter
Compliance summary requested for upcomming appointment.  Dontravious Camille, RN

## 2019-07-02 ENCOUNTER — Encounter: Admit: 2019-07-02 | Discharge: 2019-07-02 | Payer: MEDICARE

## 2019-07-02 ENCOUNTER — Ambulatory Visit: Admit: 2019-07-02 | Discharge: 2019-07-02 | Payer: MEDICARE

## 2019-07-02 DIAGNOSIS — E785 Hyperlipidemia, unspecified: Secondary | ICD-10-CM

## 2019-07-02 DIAGNOSIS — K219 Gastro-esophageal reflux disease without esophagitis: Secondary | ICD-10-CM

## 2019-07-02 DIAGNOSIS — I219 Acute myocardial infarction, unspecified: Secondary | ICD-10-CM

## 2019-07-02 DIAGNOSIS — I10 Essential (primary) hypertension: Secondary | ICD-10-CM

## 2019-07-02 DIAGNOSIS — J302 Other seasonal allergic rhinitis: Secondary | ICD-10-CM

## 2019-07-02 DIAGNOSIS — I2510 Atherosclerotic heart disease of native coronary artery without angina pectoris: Secondary | ICD-10-CM

## 2019-07-02 DIAGNOSIS — G4733 Obstructive sleep apnea (adult) (pediatric): Secondary | ICD-10-CM

## 2019-07-02 DIAGNOSIS — E039 Hypothyroidism, unspecified: Secondary | ICD-10-CM

## 2019-07-02 DIAGNOSIS — I00 Rheumatic fever without heart involvement: Secondary | ICD-10-CM

## 2019-07-02 DIAGNOSIS — I491 Atrial premature depolarization: Secondary | ICD-10-CM

## 2019-07-02 DIAGNOSIS — J189 Pneumonia, unspecified organism: Secondary | ICD-10-CM

## 2019-07-02 NOTE — Progress Notes
Obtained patient's verbal consent to treat them and their agreement to [facility] financial policy and NPP via this telehealth visit during the [facility]          Jack Wagner is a 78 y.o. male.    Here for sleep apnea fu. Compliance visit.   Known pt to Dr. Evonnie Pat.     Overall feels like the CPAP has helped him.    Curious about nasal pillow, currently nasal mask.   No dry mouth.   No snoring.   Usually falls asleep in the chair so sleeps an additional 2 hours to his download data.          Review of Systems   Constitutional: Negative for fatigue.   Respiratory: Positive for apnea.    Psychiatric/Behavioral: Negative for sleep disturbance.   All other systems reviewed and are negative.      Objective:         ? albuterol sulfate (PROAIR HFA) 90 mcg/actuation aerosol inhaler Inhale two puffs by mouth into the lungs every 6 hours as needed for Wheezing or Shortness of Breath. Shake well before use.   ? ascorbic acid (VITAMIN C) 500 mg tablet Take 500 mg by mouth daily.   ? Aspirin 81 mg Tab Take 1 Tab by mouth Daily.   ? Calcium Carbonate (CORAL CALCIUM) 1,000 mg tab Take 1 tablet by mouth daily.   ? cholecalciferol (VITAMIN D-3) 50 mcg (2,000 unit) tablet Take 2,000 Units by mouth daily.   ? cyanocobalamin (VITAMIN B-12) 1,000 mcg tablet Take 1,000 mcg by mouth daily.   ? fluticasone propionate (FLONASE) 50 mcg/actuation nasal spray, suspension Apply two sprays to each nostril as directed daily. Shake bottle gently before using.   ? folic acid/multivit-min/lutein (CENTRUM SILVER PO) Take 1 tablet by mouth daily.   ? Ginkgo Biloba 60 mg cap Take 2 capsules by mouth daily.   ? glucosamine HCl/chondroitin su (GLUCOSAMINE-CHONDROITIN PO) Take 1 tablet by mouth twice daily.   ? krill-om-3-dha-epa-phospho-ast (MEGARED OMEGA-3 KRILL OIL) 300-90-24-50 mg cap Take 1 capsule by mouth daily.   ? lansoprazole(+) DR (PREVACID) 15 mg capsule Take 15 mg by mouth daily 30 minutes before breakfast. ? levothyroxine (SYNTHROID) 112 mcg tablet TAKE 1 TABLET DAILY 30 MINUTES BEFORE BREAKFAST   ? losartan (COZAAR) 25 mg tablet Take one tablet by mouth daily.   ? nitroglycerin (NITROSTAT) 0.4 mg tablet Place one tablet under tongue every 5 minutes as needed for Chest Pain.   ? Potassium Gluconate 595 mg (99 mg) tab Take 1 tablet by mouth daily.   ? pyridoxine (vitamin B6) (VITAMIN B-6) 100 mg tablet Take 100 mg by mouth daily.   ? rosuvastatin (CRESTOR) 20 mg tablet TAKE 1 TABLET DAILY   ? selenium 200 mcg cap Take 1 capsule by mouth daily.   ? sertraline (ZOLOFT) 50 mg tablet Take 25 mg by mouth daily.   ? tadalafiL (CIALIS) 10 mg tablet Take one tablet by mouth as Needed for Erectile dysfunction.   ? tiotropium bromide (SPIRIVA RESPIMAT) 2.5 mcg/actuation inhaler Inhale two puffs by mouth into the lungs daily.     Vitals:    07/02/19 0752   Weight: 83.9 kg (185 lb)   Height: 182.9 cm (72)     Body mass index is 25.09 kg/m?Marland Kitchen     Physical Exam  No video         Reviewed CPAP download.        Assessment and Plan:  Problem   Osa (Obstructive Sleep Apnea)    Home sleep study done on 72,020 showed moderate sleep apnea with AHI of 22.7.  Pt set up with CPAP @ 5-15cm H2O on 05/09/19.  DME: [facility]  Insurance: Medicare  If the patient has to meet compliance, their compliance window will be from 06/09/19 to 08/07/19. met         OSA (obstructive sleep apnea)  Good compliance.  No pressure change.   Residual <5.  Benefits from therapy.  Knows to get replacement supplies.              Orders Placed This Encounter   ? AMB REFERRAL TO HOME CARE       Total phone time 5 minutes.  Additional 3 minutes spent coordinating visit, documentation and placing corresponding orders

## 2019-07-02 NOTE — Patient Instructions
Weight loss and weight gain can significantly and usually proportionally effect sleep apnea.   Please let me know if you are having issues getting or receiving your CPAP supplies.  I recommend the flu shot if you have not already had it.   Pulmonary RN -Robin Donnelly RN [phone]. Or you can always reach me on mychart.    For refills on medications, please have your pharmacy fax a refill authorization request form to our office at Fax) [phone].     Please allow at least 3 business days for refill requests.   For urgent issues after business hours/weekends/holidays call [phone] and request for the pulmonary fellow to be paged

## 2019-07-02 NOTE — Progress Notes
Did patient read financial policy, consent to treat, and notice of privacy practices? Yes    Does the patient give verbal consent to each policy? Yes    Does the patient have any vitals to report? Yes    Weight Vitals charted in O2? Yes    Is the patient in pain? 0 = No pain    Screening questions completed? Yes    Is the patient able to acces the Mychart message with the start visit link? No    Is patient in "virtual waiting room" No

## 2019-07-02 NOTE — Assessment & Plan Note
Good compliance.  No pressure change.   Residual <5.  Benefits from therapy.  Knows to get replacement supplies.

## 2019-07-05 ENCOUNTER — Encounter: Admit: 2019-07-05 | Discharge: 2019-07-05 | Payer: MEDICARE

## 2019-07-05 NOTE — Telephone Encounter
Faxing Compliance note to DME for Compliance follow up.   Ahmed Inniss RN

## 2019-08-09 ENCOUNTER — Encounter: Admit: 2019-08-09 | Discharge: 2019-08-09 | Payer: MEDICARE

## 2019-08-09 NOTE — Telephone Encounter
Received request from DME for CPAP f/u office notes.     The patient's PAP compliance f/u office visit notes from 07/02/19 were faxed to their DME, [facility]. (2nd time sending notes)

## 2019-08-27 ENCOUNTER — Encounter: Admit: 2019-08-27 | Discharge: 2019-08-27 | Payer: MEDICARE

## 2019-08-27 DIAGNOSIS — E039 Hypothyroidism, unspecified: Secondary | ICD-10-CM

## 2019-08-27 DIAGNOSIS — K219 Gastro-esophageal reflux disease without esophagitis: Secondary | ICD-10-CM

## 2019-08-27 DIAGNOSIS — R05 Cough: Secondary | ICD-10-CM

## 2019-08-27 DIAGNOSIS — J302 Other seasonal allergic rhinitis: Secondary | ICD-10-CM

## 2019-08-27 DIAGNOSIS — I6523 Occlusion and stenosis of bilateral carotid arteries: Secondary | ICD-10-CM

## 2019-08-27 DIAGNOSIS — G4733 Obstructive sleep apnea (adult) (pediatric): Secondary | ICD-10-CM

## 2019-08-27 DIAGNOSIS — E7800 Pure hypercholesterolemia, unspecified: Secondary | ICD-10-CM

## 2019-08-27 DIAGNOSIS — I10 Essential (primary) hypertension: Secondary | ICD-10-CM

## 2019-08-27 DIAGNOSIS — E785 Hyperlipidemia, unspecified: Secondary | ICD-10-CM

## 2019-08-27 DIAGNOSIS — I2510 Atherosclerotic heart disease of native coronary artery without angina pectoris: Secondary | ICD-10-CM

## 2019-08-27 DIAGNOSIS — J189 Pneumonia, unspecified organism: Secondary | ICD-10-CM

## 2019-08-27 DIAGNOSIS — I219 Acute myocardial infarction, unspecified: Secondary | ICD-10-CM

## 2019-08-27 DIAGNOSIS — I491 Atrial premature depolarization: Secondary | ICD-10-CM

## 2019-08-27 DIAGNOSIS — I00 Rheumatic fever without heart involvement: Secondary | ICD-10-CM

## 2019-08-27 MED ORDER — TADALAFIL 10 MG PO TAB
10 mg | ORAL_TABLET | ORAL | 3 refills | Status: DC | PRN
Start: 2019-08-27 — End: 2020-02-25

## 2019-08-27 MED ORDER — FLUTICASONE PROPIONATE 50 MCG/ACTUATION NA SPSN
2 | Freq: Every day | NASAL | 3 refills | 60.00000 days | Status: AC
Start: 2019-08-27 — End: ?

## 2019-08-27 NOTE — Telephone Encounter
Received email from [employer] requesting pt's CPAP compliance office notes.  Replied and advised that it's been sent over 2 or 3 times now via fax. Per DME, they still do not have it.  Chart notes dated 07/02/19 emailed to [employer] at [employer].

## 2019-08-27 NOTE — Progress Notes
Date of Service: 08/27/2019    Jaxstin Zenisek is a 78 y.o. male.       HPI     Mr. Dam is a 78 year old white male with a history of coronary artery disease.  He is doing well from a cardiac standpoint.  Patient has not experience symptoms of chest pain and he has not taken any sublingual nitroglycerin.    Patient does have a history of ACE inhibitor induced cough and he was switched to losartan for hypertension treatment.  Patient does admit to dietary indiscretion as far as the sodium diet.  His blood pressure today is mildly elevated.    This patient is also known to have bilateral, asymptomatic carotid artery disease.    He does have CAD, he underwent PCI of the proximal to mid RCA with a DES in March 2018.         Vitals:    08/27/19 0832 08/27/19 0834   BP: 136/72 (!) 146/80   BP Source: Arm, Left Upper Arm, Right Upper   Patient Position: Sitting Sitting   Pulse: 78    Temp: 36.5 ?C (97.7 ?F)    TempSrc: Oral    SpO2: 98%    Weight: 89 kg (196 lb 3.2 oz)    Height: 1.829 m (6')    PainSc: Zero      Body mass index is 26.61 kg/m?Marland Kitchen     Past Medical History  Patient Active Problem List    Diagnosis Date Noted   ? OSA (obstructive sleep apnea) 05/09/2019     Home sleep study done on 72,020 showed moderate sleep apnea with AHI of 22.7.  Pt set up with CPAP @ 5-15cm H2O on 05/09/19.  DME: [facility]  Insurance: Medicare  If the patient has to meet compliance, their compliance window will be from 06/09/19 to 08/07/19. met     ? Abnormal Holter exam 04/30/2019   ? Fall 12/25/2018   ? Bilateral carotid artery stenosis 12/25/2018   ? Stented coronary artery 05/24/2018   ? Cough 10/03/2017   ? ACE-inhibitor cough 10/03/2017   ? Snoring 05/29/2017     - Witnessed apneas and snoring  - ESS 10     ? COPD (chronic obstructive pulmonary disease) (HCC) 05/29/2017     GOLD Staging: A1  mMRC: 1  GOLD classification of airflow obstruction: 1  Number exacerbations in past year: 0  PFT (05/29/2017)  FVC 4.49L (101% pred) FEV1 2.65L (82% pred)  FEV1/FVC 59%  RV 2.98L (114% pred)  DLCO 94% pred  Inhaler Regimen  Spiriva 2 puffs once a day  Vaccinations  Influenza - Refused  Pneumococcal - Will need to check with PCP to ensure this has occured  Prevnar - Will need to check with PCP to ensure this has occured  Pulm Rehab   Currently performing cardiac rehab  Smoking  40 pk year, quit 10/2016  LDCT screening  Recommended to patient, he would like to consider and discuss further at upcoming appointment     ? Dysgeusia 04/12/2017     Added automatically from request for surgery [id number]     ? Weight loss 03/30/2017   ? Hypotension due to drugs 03/30/2017   ? BRBPR (bright red blood per rectum) 02/23/2017     Added automatically from request for surgery [phone]     ? Colon cancer screening 02/23/2017     Added automatically from request for surgery [id number]     ? History of anemia 02/23/2017  Added automatically from request for surgery [id number]     ? Tobacco abuse 02/23/2017     - At least 50 pk years, quit 10/2016     ? Dysphagia 02/23/2017     Added automatically from request for surgery [phone]     ? Gastroesophageal reflux disease with esophagitis 02/22/2017   ? 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 Bad breath 02/22/2017   ? Bright red rectal bleeding 02/22/2017   ? Other dysphagia 02/22/2017   ? Prediabetes 11/25/2016   ? Atrial tachycardia (HCC) 11/24/2016   ? 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 Bradycardia 05/16/2013 ? Right carotid bruit 05/16/2013   ? Dyspnea 03/01/2011   ? History of tobacco use 03/01/2011   ? PAC (premature atrial contraction) 04/08/2010   ? Abnormal cardiovascular stress test 11/12/2008   ? CAD (coronary artery disease) 11/12/2008     11/12/08: Cath - Ostial left main stenosis of 20-30%. Proximal LAD stenosis of 30-40%. Diagonal ostial stenosis of 60%. Mid circumflex with 30-40% stenosis. Mid RCA with a 30-40% stenosis. Medical management.    11/23/16: STEMI/Cath - Culprit lesion, subtotal 99% proximal RCA acute thrombotic lesion s/p PCI with a 3.5 x 33 mm Xience Alpine DES. Ostial 20-30% left main lesion, which appears angiographically unchanged compared to the previous films.       ? HTN (hypertension) 11/12/2008   ? HLD (hyperlipidemia) 11/12/2008   ? Hypothyroidism 11/12/2008         Review of Systems   Constitution: Positive for malaise/fatigue.   HENT: Negative.    Eyes: Negative.    Cardiovascular: Negative.    Respiratory: Negative.    Endocrine: Negative.    Hematologic/Lymphatic: Negative.    Skin: Negative.    Musculoskeletal: Negative.    Gastrointestinal: Negative.    Genitourinary: Negative.    Neurological: Negative.    Psychiatric/Behavioral: Negative.    Allergic/Immunologic: Negative.        Physical Exam  General Appearance: normal in appearance  Skin: warm, moist, no ulcers or xanthomas  Eyes: conjunctivae and lids normal, pupils are equal and round  Lips & Oral Mucosa: no pallor or cyanosis  Neck Veins: neck veins are flat, neck veins are not distended  Chest Inspection: chest is normal in appearance  Respiratory Effort: breathing comfortably, no respiratory distress  Auscultation/Percussion: lungs clear to auscultation, no rales or rhonchi, no wheezing  Cardiac Rhythm: regular rhythm and normal rate  Cardiac Auscultation: S1, S2 normal, no rub, no gallop  Murmurs: no murmur  Carotid Arteries: normal carotid upstroke bilaterally, R bruit  Lower Extremity Edema: no lower extremity edema Abdominal Exam: soft, non-tender, no masses, bowel sounds normal  Liver & Spleen: no organomegaly  Language and Memory: patient responsive and seems to comprehend information  Neurologic Exam: neurological assessment grossly intact      Cardiovascular Studies      Problems Addressed Today  Encounter Diagnoses   Name Primary?   ? Coronary artery disease involving native coronary artery of native heart without angina pectoris Yes   ? Essential hypertension    ? Pure hypercholesterolemia    ? PAC (premature atrial contraction)    ? Bilateral carotid artery stenosis    ? ACE-inhibitor cough    ? OSA (obstructive sleep apnea)        Assessment and Plan     In summary: This is a 78 year old white male with stable ischemic heart disease, patient did suffer an ST elevation MI in the past, he underwent PCI of the proximal to mid RCA, the most recent ischemic evaluation was performed with a myocardial perfusion imaging study in June 2020, the tomographic pattern was negative for ischemia and the low ventricular systolic function was normal at 60%.    In addition, patient is known to have nonobstructive carotid artery disease, hypertension currently treated with an ARB (he does have a history of ACE inhibitor induced cough), and also hyperlipidemia.    Plan:    1.  Continue all current medications  2.  I did renew today the prescription for fluticasone nasal spray, patient also requested to renew the prescription for Cialis (he has not taken any sublingual nitroglycerin).  3.  I did ask him to be compliant with a low-salt diet  4.  Follow-up office visit in 6         Current Medications (including today's revisions)   ? albuterol sulfate (PROAIR HFA) 90 mcg/actuation aerosol inhaler Inhale two puffs by mouth into the lungs every 6 hours as needed for Wheezing or Shortness of Breath. Shake well before use.   ? ascorbic acid (VITAMIN C) 500 mg tablet Take 500 mg by mouth daily.   ? Aspirin 81 mg Tab Take 1 Tab by mouth Daily. ? Calcium Carbonate (CORAL CALCIUM) 1,000 mg tab Take 1 tablet by mouth daily.   ? cholecalciferol (VITAMIN D-3) 50 mcg (2,000 unit) tablet Take 2,000 Units by mouth daily.   ? cyanocobalamin (VITAMIN B-12) 1,000 mcg tablet Take 1,000 mcg by mouth daily.   ? fluticasone propionate (FLONASE) 50 mcg/actuation nasal spray, suspension Apply two sprays to each nostril as directed daily. Shake bottle gently before using.   ? folic acid/multivit-min/lutein (CENTRUM SILVER PO) Take 1 tablet by mouth daily.   ? Ginkgo Biloba 60 mg cap Take 2 capsules by mouth daily.   ? glucosamine HCl/chondroitin su (GLUCOSAMINE-CHONDROITIN PO) Take 1 tablet by mouth twice daily.   ? krill-om-3-dha-epa-phospho-ast (MEGARED OMEGA-3 KRILL OIL) 300-90-24-50 mg cap Take 1 capsule by mouth daily.   ? lansoprazole(+) DR (PREVACID) 15 mg capsule Take 15 mg by mouth daily 30 minutes before breakfast.   ? levothyroxine (SYNTHROID) 112 mcg tablet TAKE 1 TABLET DAILY 30 MINUTES BEFORE BREAKFAST   ? losartan (COZAAR) 25 mg tablet Take one tablet by mouth daily.   ? nitroglycerin (NITROSTAT) 0.4 mg tablet Place one tablet under tongue every 5 minutes as needed for Chest Pain.   ? Potassium Gluconate 595 mg (99 mg) tab Take 1 tablet by mouth daily.   ? pyridoxine (vitamin B6) (VITAMIN B-6) 100 mg tablet Take 100 mg by mouth daily.   ? rosuvastatin (CRESTOR) 20 mg tablet TAKE 1 TABLET DAILY   ? selenium 200 mcg cap Take 1 capsule by mouth daily.   ? sertraline (ZOLOFT) 50 mg tablet Take 25 mg by mouth daily.   ? tadalafiL (CIALIS) 10 mg tablet Take one tablet  by mouth as Needed for Erectile dysfunction.   ? tiotropium bromide (SPIRIVA RESPIMAT) 2.5 mcg/actuation inhaler Inhale two puffs by mouth into the lungs daily.

## 2019-08-27 NOTE — Patient Instructions
Thank you for visiting our office today.    Continue the same medications as you have been doing.          We will plan to see you back in 6 months.  Please call us in the meantime with any questions or concerns.        Please allow 5-7 business days for our providers to review your results. All normal results will go to MyChart. If you do not have Mychart, it is strongly recommended to get this so you can easily view all your results. If you do not have mychart, we will attempt to call you once with normal lab and testing results. If we cannot reach you by phone with normal results, we will send you a letter.  If you have not heard the results of your testing after one week please give [place] a call.       Your Cardiovascular Medicine [place] Team Richardson Landry, Rene Kocher and [place])  phone number is [phone].

## 2020-02-06 ENCOUNTER — Encounter: Admit: 2020-02-06 | Discharge: 2020-02-06 | Payer: MEDICARE

## 2020-02-07 MED ORDER — LOSARTAN 25 MG PO TAB
ORAL_TABLET | Freq: Every day | ORAL | 3 refills | 30.00000 days | Status: DC
Start: 2020-02-07 — End: 2020-02-25

## 2020-02-19 ENCOUNTER — Encounter: Admit: 2020-02-19 | Discharge: 2020-02-19 | Payer: MEDICARE

## 2020-02-19 NOTE — Progress Notes
Please send the following records for continuity of care.     Most recent lipid and liver profile, chemistry panel, and thyroid panel.     Please fax to:  [facility]ention    Fax: [phone]    Thank you,     [facility]  Cardiovascular Medicine  [address] [place] [address]  Phone: [phone]  Fax: [phone]

## 2020-02-25 ENCOUNTER — Ambulatory Visit: Admit: 2020-02-25 | Discharge: 2020-02-25 | Payer: MEDICARE

## 2020-02-25 ENCOUNTER — Encounter: Admit: 2020-02-25 | Discharge: 2020-02-25 | Payer: MEDICARE

## 2020-02-25 DIAGNOSIS — I2510 Atherosclerotic heart disease of native coronary artery without angina pectoris: Secondary | ICD-10-CM

## 2020-02-25 DIAGNOSIS — G4733 Obstructive sleep apnea (adult) (pediatric): Secondary | ICD-10-CM

## 2020-02-25 DIAGNOSIS — I00 Rheumatic fever without heart involvement: Secondary | ICD-10-CM

## 2020-02-25 DIAGNOSIS — I219 Acute myocardial infarction, unspecified: Secondary | ICD-10-CM

## 2020-02-25 DIAGNOSIS — Z23 Encounter for immunization: Secondary | ICD-10-CM

## 2020-02-25 DIAGNOSIS — I952 Hypotension due to drugs: Secondary | ICD-10-CM

## 2020-02-25 DIAGNOSIS — R05 Cough: Secondary | ICD-10-CM

## 2020-02-25 DIAGNOSIS — I6523 Occlusion and stenosis of bilateral carotid arteries: Secondary | ICD-10-CM

## 2020-02-25 DIAGNOSIS — J189 Pneumonia, unspecified organism: Secondary | ICD-10-CM

## 2020-02-25 DIAGNOSIS — Z951 Presence of aortocoronary bypass graft: Secondary | ICD-10-CM

## 2020-02-25 DIAGNOSIS — E039 Hypothyroidism, unspecified: Secondary | ICD-10-CM

## 2020-02-25 DIAGNOSIS — E7800 Pure hypercholesterolemia, unspecified: Secondary | ICD-10-CM

## 2020-02-25 DIAGNOSIS — K219 Gastro-esophageal reflux disease without esophagitis: Secondary | ICD-10-CM

## 2020-02-25 DIAGNOSIS — I471 Supraventricular tachycardia: Secondary | ICD-10-CM

## 2020-02-25 DIAGNOSIS — E038 Other specified hypothyroidism: Secondary | ICD-10-CM

## 2020-02-25 DIAGNOSIS — Z955 Presence of coronary angioplasty implant and graft: Secondary | ICD-10-CM

## 2020-02-25 DIAGNOSIS — I10 Essential (primary) hypertension: Secondary | ICD-10-CM

## 2020-02-25 DIAGNOSIS — Z87891 Personal history of nicotine dependence: Secondary | ICD-10-CM

## 2020-02-25 DIAGNOSIS — J302 Other seasonal allergic rhinitis: Secondary | ICD-10-CM

## 2020-02-25 DIAGNOSIS — I2111 ST elevation (STEMI) myocardial infarction involving right coronary artery: Secondary | ICD-10-CM

## 2020-02-25 DIAGNOSIS — I491 Atrial premature depolarization: Secondary | ICD-10-CM

## 2020-02-25 DIAGNOSIS — E785 Hyperlipidemia, unspecified: Secondary | ICD-10-CM

## 2020-02-25 DIAGNOSIS — R001 Bradycardia, unspecified: Secondary | ICD-10-CM

## 2020-02-25 MED ORDER — TADALAFIL 10 MG PO TAB
10 mg | ORAL_TABLET | ORAL | 3 refills | Status: AC | PRN
Start: 2020-02-25 — End: ?

## 2020-02-25 MED ORDER — LOSARTAN 25 MG PO TAB
25 mg | ORAL_TABLET | Freq: Two times a day (BID) | ORAL | 3 refills | 30.00000 days | Status: AC
Start: 2020-02-25 — End: ?

## 2020-02-25 NOTE — Progress Notes
Long Term Monitor Placement Record  Ordering Physician: MNH   Diagnosis: bradycardia  Length: 14 days  Serial Number: [id number]  applied by js

## 2020-02-25 NOTE — Progress Notes
Enrollment complete

## 2020-03-06 ENCOUNTER — Ambulatory Visit: Admit: 2020-03-06 | Discharge: 2020-03-06 | Payer: MEDICARE

## 2020-03-06 ENCOUNTER — Encounter: Admit: 2020-03-06 | Discharge: 2020-03-06 | Payer: MEDICARE

## 2020-03-06 DIAGNOSIS — I10 Essential (primary) hypertension: Secondary | ICD-10-CM

## 2020-03-06 DIAGNOSIS — I2510 Atherosclerotic heart disease of native coronary artery without angina pectoris: Secondary | ICD-10-CM

## 2020-03-06 DIAGNOSIS — I6523 Occlusion and stenosis of bilateral carotid arteries: Secondary | ICD-10-CM

## 2020-03-06 NOTE — Progress Notes
Routed to Dr. Avie Arenas for review.

## 2020-03-12 ENCOUNTER — Encounter: Admit: 2020-03-12 | Discharge: 2020-03-12 | Payer: MEDICARE

## 2020-03-23 ENCOUNTER — Encounter: Admit: 2020-03-23 | Discharge: 2020-03-23 | Payer: MEDICARE

## 2020-03-23 NOTE — Telephone Encounter
-----   Message from Dorris Fetch, MD sent at 03/20/2020  9:32 PM CDT -----  Please call the patient let her know that her Holter monitor did not demonstrate any prolonged episodes of very low heart rate, it does not appear that he needs a pacemaker.  Actually, the heart rate was stable or throughout the recording.He already had an echocardiogram and I believe already sent you a message about that study.I think there is a carotid duplex pending, we will call with those results when available.I mentioned that he should come back and see me in 6 months from the last office visit which would be December 2021, I think if the carotid duplex to look good he can probably come back and see me next spring in March April 2022.Thank you  ----- Message -----  From: Dorris Fetch, MD  Sent: 03/20/2020   9:30 PM CDT  To: Dorris Fetch, MD

## 2020-03-23 NOTE — Telephone Encounter
Called and discussed results with patient.  No questions at this time.  Pt will callback with any questions, concerns or problems.

## 2020-03-23 NOTE — Telephone Encounter
Jack Fetch, MD  P Cvm Nurse [place]   Please let this patient know that his echocardiogram demonstrated normal heart function.    He does have other tests pending, will call with those results when available.    Thank you

## 2020-03-24 ENCOUNTER — Encounter: Admit: 2020-03-24 | Discharge: 2020-03-24 | Payer: MEDICARE

## 2020-03-24 NOTE — Telephone Encounter
Patient called in wanting RN to look up pap to see if it is a recalled machine,    Pap is  Not a recalled machine,    Called out to patient to discuss, advised that his machine is not a recalled machine and advised that he needs to wear his machine longer in the night as of now he is not compliant as he does not wear machine greater than 4 hours each night. Patient states that he was to get a new mask to try. Order sent to [facility] back in November 2020  Inquiry sent to Alana at [facility] to see where this order stands   Con Memos, RN

## 2020-03-26 ENCOUNTER — Encounter: Admit: 2020-03-26 | Discharge: 2020-03-26 | Payer: MEDICARE

## 2020-03-26 NOTE — Telephone Encounter
Order for mask refit and supplies placed as discussed per chart note on 03/25/20    DME: [facility]  Will continue to follow up on order.

## 2020-04-30 ENCOUNTER — Encounter: Admit: 2020-04-30 | Discharge: 2020-04-30 | Payer: MEDICARE

## 2020-04-30 NOTE — Telephone Encounter
Received request from DME for provider to sign form necessary for billing. Form completed and given to provider for signature.    Form will be faxed to [facility] and a copy scanned to pt's chart.

## 2020-06-01 ENCOUNTER — Encounter: Admit: 2020-06-01 | Discharge: 2020-06-01 | Payer: MEDICARE

## 2020-08-27 ENCOUNTER — Encounter: Admit: 2020-08-27 | Discharge: 2020-08-27 | Payer: MEDICARE

## 2020-08-27 NOTE — Patient Instructions
-   If you need to schedule or reschedule an appointment, please call [phone].     - For any urgent issues after business hours, please call [phone] and have the on call pulmonary physician paged.    - If you have any questions regarding your appointment or any concerns, please contact my pulmonary care nurse at  [phone]    -Refills on medications, please have your pharmacy fax a refill authorization request form to our office at Fax) [phone].     Please allow at least 3 business days for refill requests.

## 2020-08-27 NOTE — Progress Notes
Obtained patient's verbal consent to treat them and their agreement to [facility] financial policy and NPP via this telehealth visit during the [facility] Emergency          Jack Wagner is a 79 y.o. male.    Here for sleep apnea fu. Annual visit.  Known pt to Dr. Evonnie Pat    Very limited time on CPAP 2/2  He gets wrapped up in the tubes and this wakes him up. He is waking up multiple times through the night.   The CPAP itself is waking him up.    Feels the CPAP helps him, but can only wear a portion of the night.  Has tried different hoses etc.        Review of Systems    Objective:         ? albuterol sulfate (PROAIR HFA) 90 mcg/actuation aerosol inhaler Inhale two puffs by mouth into the lungs every 6 hours as needed for Wheezing or Shortness of Breath. Shake well before use.   ? ascorbic acid (VITAMIN C) 500 mg tablet Take 500 mg by mouth daily.   ? Aspirin 81 mg Tab Take 1 Tab by mouth Daily.   ? Calcium Carbonate (CORAL CALCIUM) 1,000 mg tab Take 1 tablet by mouth daily.   ? cholecalciferol (VITAMIN D-3) 50 mcg (2,000 unit) tablet Take 2,000 Units by mouth daily.   ? cyanocobalamin (VITAMIN B-12) 1,000 mcg tablet Take 1,000 mcg by mouth daily.   ? fluticasone propionate (FLONASE) 50 mcg/actuation nasal spray, suspension Apply two sprays to each nostril as directed daily. Shake bottle gently before using.   ? folic acid/multivit-min/lutein (CENTRUM SILVER PO) Take 1 tablet by mouth daily.   ? Ginkgo Biloba 60 mg cap Take 2 capsules by mouth daily.   ? glucosamine HCl/chondroitin su (GLUCOSAMINE-CHONDROITIN PO) Take 1 tablet by mouth twice daily.   ? krill-om-3-dha-epa-phospho-ast (MEGARED OMEGA-3 KRILL OIL) 300-90-24-50 mg cap Take 1 capsule by mouth daily.   ? lansoprazole(+) DR (PREVACID) 15 mg capsule Take 15 mg by mouth daily 30 minutes before breakfast.   ? levothyroxine (SYNTHROID) 112 mcg tablet TAKE 1 TABLET DAILY 30 MINUTES BEFORE BREAKFAST   ? losartan (COZAAR) 25 mg tablet Take one tablet by mouth twice daily.   ? nitroglycerin (NITROSTAT) 0.4 mg tablet Place one tablet under tongue every 5 minutes as needed for Chest Pain.   ? Potassium Gluconate 595 mg (99 mg) tab Take 1 tablet by mouth daily.   ? pyridoxine (vitamin B6) (VITAMIN B-6) 100 mg tablet Take 100 mg by mouth daily.   ? rosuvastatin (CRESTOR) 20 mg tablet TAKE 1 TABLET DAILY   ? selenium 200 mcg cap Take 1 capsule by mouth daily.   ? sertraline (ZOLOFT) 50 mg tablet Take 25 mg by mouth daily.   ? tadalafiL (CIALIS) 10 mg tablet Take one tablet by mouth as Needed for Erectile dysfunction.   ? tiotropium bromide (SPIRIVA RESPIMAT) 2.5 mcg/actuation inhaler Inhale two puffs by mouth into the lungs daily.   ? zolpidem (AMBIEN) 5 mg tablet Take one-half tablet by mouth at bedtime daily.     Vitals:    09/01/20 0802   Weight: 90.7 kg (200 lb)   Height: 182.9 cm (72)   PainSc: Zero     Body mass index is 27.12 kg/m?Marland Kitchen     Physical Exam  Constitutional:       Appearance: Normal appearance.   Pulmonary:      Effort: Pulmonary effort is  normal.   Skin:     General: Skin is warm and dry.      Coloration: Skin is not pale.      Findings: No erythema.   Neurological:      Mental Status: He is alert.   Psychiatric:         Mood and Affect: Mood normal.         Behavior: Behavior normal.         Thought Content: Thought content normal.            Reviewed CPAP download.        Assessment and Plan:      Epworth 12/24    Problem   Osa (Obstructive Sleep Apnea)    Home sleep study done on 72,020 showed moderate sleep apnea with AHI of 22.7.  Pt set up with CPAP @ 5-15cm H2O on 05/09/19.  DME: [facility]  Insurance: Medicare  If the patient has to meet compliance, their compliance window will be from 06/09/19 to 08/07/19. met         OSA (obstructive sleep apnea)  Needs to increase compliance, pt putting CPAP on nightly. CPAP disruptive despite trying multiple different changes to correct. Going to try lose dose Ambien to see if can get acclimated to CPAP. Ambien 2.5mg  for 1-2 months- not long term. May need to wear during wake hours to get 4 hours for medicare compliance.   No pressure change.   Residual <5.  Benefits from therapy.  Knows to get replacement supplies.   Advised not to use SoClean machine or similar ozone machines.               Orders Placed This Encounter   ? zolpidem (AMBIEN) 5 mg tablet       Total face time 20 minutes, time prolonged due to IT issues.  Additional 5 minutes spent coordinating visit, documentation/review of prior testing or other data and placing corresponding orders

## 2020-08-27 NOTE — Telephone Encounter
RN prepped patient information for clinic with Alean Rinne APRN NP on 09/02/19 at 9:00 via in person.    Annual follow up    Last SS: 03/06/19  AHI: 22.7    LV: 07/02/2019 with Alean Rinne APRN  Collaborating MD: Dr.Duthuluru    Pt set up withCPAP @5 -15cm H2O on9/10/20.  07/09/19  Insurance:Medicare    RN obtained download from AV

## 2020-09-01 ENCOUNTER — Encounter: Admit: 2020-09-01 | Discharge: 2020-09-01 | Payer: MEDICARE

## 2020-09-01 ENCOUNTER — Ambulatory Visit: Admit: 2020-09-01 | Discharge: 2020-09-02 | Payer: MEDICARE

## 2020-09-01 DIAGNOSIS — I491 Atrial premature depolarization: Secondary | ICD-10-CM

## 2020-09-01 DIAGNOSIS — I219 Acute myocardial infarction, unspecified: Secondary | ICD-10-CM

## 2020-09-01 DIAGNOSIS — I2510 Atherosclerotic heart disease of native coronary artery without angina pectoris: Secondary | ICD-10-CM

## 2020-09-01 DIAGNOSIS — G4733 Obstructive sleep apnea (adult) (pediatric): Secondary | ICD-10-CM

## 2020-09-01 DIAGNOSIS — E785 Hyperlipidemia, unspecified: Secondary | ICD-10-CM

## 2020-09-01 DIAGNOSIS — K219 Gastro-esophageal reflux disease without esophagitis: Secondary | ICD-10-CM

## 2020-09-01 DIAGNOSIS — E039 Hypothyroidism, unspecified: Secondary | ICD-10-CM

## 2020-09-01 DIAGNOSIS — J302 Other seasonal allergic rhinitis: Secondary | ICD-10-CM

## 2020-09-01 DIAGNOSIS — J189 Pneumonia, unspecified organism: Secondary | ICD-10-CM

## 2020-09-01 DIAGNOSIS — I00 Rheumatic fever without heart involvement: Secondary | ICD-10-CM

## 2020-09-01 DIAGNOSIS — I10 Essential (primary) hypertension: Secondary | ICD-10-CM

## 2020-09-01 MED ORDER — ZOLPIDEM 5 MG PO TAB
2.5 mg | ORAL_TABLET | Freq: Every evening | ORAL | 1 refills | 30.00000 days | Status: AC
Start: 2020-09-01 — End: ?

## 2020-09-01 NOTE — Progress Notes
Did patient read financial policy, consent to treat, and notice of privacy practices? Yes    Does the patient give verbal consent to each policy? Yes    Does the patient have any vitals to report? Yes    Weight Vitals charted in O2? Yes    Is the patient in pain? 0 = No pain    Screening questions completed? Yes    Is the patient able to acces the Mychart message with the start visit link? Yes    Is patient in "virtual waiting room" No  Please answer thinking about these questions over the last 2 weeks.     Epworth Sleepiness Scale    How likely are you to fall asleep doing these following activities?  Please rate your answers based on the scale below:  0= Would never fall doze  1= Slight chance you would doze  2= Moderate chance you would doze  3= High chance you would doze    Sitting and reading: 1  Watching TV:  1  Sitting inactive in a public place: 1  Being a passenger in a motor vehicle for an hour or more:  2  Lying down in the afternoon:  0  Sitting and talking to someone: 3  Sitting quietly after lunch (no alcohol):  3  Stopped for a few minutes in traffic:  1  Total Score:  12

## 2020-09-10 ENCOUNTER — Encounter: Admit: 2020-09-10 | Discharge: 2020-09-10 | Payer: MEDICARE

## 2020-09-10 DIAGNOSIS — G4733 Obstructive sleep apnea (adult) (pediatric): Secondary | ICD-10-CM

## 2020-09-10 DIAGNOSIS — R058 ACE-inhibitor cough: Secondary | ICD-10-CM

## 2020-09-10 DIAGNOSIS — E785 Hyperlipidemia, unspecified: Secondary | ICD-10-CM

## 2020-09-10 DIAGNOSIS — I219 Acute myocardial infarction, unspecified: Secondary | ICD-10-CM

## 2020-09-10 DIAGNOSIS — I491 Atrial premature depolarization: Secondary | ICD-10-CM

## 2020-09-10 DIAGNOSIS — I2510 Atherosclerotic heart disease of native coronary artery without angina pectoris: Secondary | ICD-10-CM

## 2020-09-10 DIAGNOSIS — K219 Gastro-esophageal reflux disease without esophagitis: Secondary | ICD-10-CM

## 2020-09-10 DIAGNOSIS — J302 Other seasonal allergic rhinitis: Secondary | ICD-10-CM

## 2020-09-10 DIAGNOSIS — E039 Hypothyroidism, unspecified: Secondary | ICD-10-CM

## 2020-09-10 DIAGNOSIS — I6523 Occlusion and stenosis of bilateral carotid arteries: Secondary | ICD-10-CM

## 2020-09-10 DIAGNOSIS — I00 Rheumatic fever without heart involvement: Secondary | ICD-10-CM

## 2020-09-10 DIAGNOSIS — J189 Pneumonia, unspecified organism: Secondary | ICD-10-CM

## 2020-09-10 DIAGNOSIS — J449 Chronic obstructive pulmonary disease, unspecified: Secondary | ICD-10-CM

## 2020-09-10 DIAGNOSIS — E7800 Pure hypercholesterolemia, unspecified: Secondary | ICD-10-CM

## 2020-09-10 DIAGNOSIS — Z23 Encounter for immunization: Secondary | ICD-10-CM

## 2020-09-10 DIAGNOSIS — I2111 ST elevation (STEMI) myocardial infarction involving right coronary artery: Secondary | ICD-10-CM

## 2020-09-10 DIAGNOSIS — I952 Hypotension due to drugs: Secondary | ICD-10-CM

## 2020-09-10 DIAGNOSIS — I471 Supraventricular tachycardia: Secondary | ICD-10-CM

## 2020-09-10 DIAGNOSIS — I10 Essential (primary) hypertension: Secondary | ICD-10-CM

## 2020-09-10 DIAGNOSIS — R9431 Abnormal electrocardiogram [ECG] [EKG]: Secondary | ICD-10-CM

## 2020-09-10 NOTE — Progress Notes
Date of Service: 09/10/2020    Jack Wagner is a 80 y.o. male.       HPI     Jack Wagner is a 80 year old white male with known history of hypertension, bradycardia, coronary artery disease and history of ST elevation MI, status post PCI of the proximal LAD and mid RCA March 2018, patient was last evaluated in our office in June 2021. Patient is also known to have nonobstructive carotid artery disease, the last carotid artery duplex was performed in May 2020, he was found to have mild to moderate atheromatous plaque and calcific plaque in the bilateral common and internal carotid arteries, moderate stenosis in the mid right internal carotid artery with significant tortuosity, there was no other significant obstructive disease.     In June July 2021patient was evaluated with the following: 2D echo Doppler study?normal LV function, normal diastolic dysfunction, no valvular abnormalities and normal pulmonary artery pressure.  2. A 14 days Holter monitor?demonstrated normal sinus rhythm, low burden of supraventricular ectopy and ventricular ectopy overall no significant arrhythmia, it was no evidence of atrial fibrillation, no atrial flutter and no high degree AV block.    Patient is here today for a follow-up office visit. He continues to remain physically active. He has not had symptoms or chest pain, no heart palpitations, has not taken sublingual nitroglycerin, he has not experienced any presyncope or syncope. Today in our office the blood pressure and heart rate were under good control.         Vitals:    09/10/20 1423   BP: 134/68   BP Source: Arm, Left Upper   Patient Position: Sitting   Weight: 91.2 kg (201 lb)   Height: 1.829 m (6')   PainSc: Zero     Body mass index is 27.26 kg/m?Marland Kitchen     Past Medical History  Patient Active Problem List    Diagnosis Date Noted   ? COVID-19 vaccine administered 02/25/2020   ? Bradycardia 02/25/2020   ? PAC (premature atrial contraction) 02/25/2020   ? OSA (obstructive sleep apnea) 05/09/2019     Home sleep study done on 72,020 showed moderate sleep apnea with AHI of 22.7.  Pt set up with CPAP @ 5-15cm H2O on 05/09/19.  DME: [facility]  Insurance: Medicare  If the patient has to meet compliance, their compliance window will be from 06/09/19 to 08/07/19. met     ? Abnormal Holter exam 04/30/2019   ? Fall 12/25/2018   ? Bilateral carotid artery stenosis 12/25/2018   ? Stented coronary artery 05/24/2018   ? Cough 10/03/2017   ? ACE-inhibitor cough 10/03/2017   ? Snoring 05/29/2017     - Witnessed apneas and snoring  - ESS 10     ? COPD (chronic obstructive pulmonary disease) (HCC) 05/29/2017     GOLD Staging: A1  mMRC: 1  GOLD classification of airflow obstruction: 1  Number exacerbations in past year: 0  PFT (05/29/2017)  FVC 4.49L (101% pred)  FEV1 2.65L (82% pred)  FEV1/FVC 59%  RV 2.98L (114% pred)  DLCO 94% pred  Inhaler Regimen  Spiriva 2 puffs once a day  Vaccinations  Influenza - Refused  Pneumococcal - Will need to check with PCP to ensure this has occured  Prevnar - Will need to check with PCP to ensure this has occured  Pulm Rehab   Currently performing cardiac rehab  Smoking  40 pk year, quit 10/2016  LDCT screening  Recommended to patient, he would  like to consider and discuss further at upcoming appointment     ? Dysgeusia 04/12/2017     Added automatically from request for surgery [phone]     ? Weight loss 03/30/2017   ? Hypotension due to drugs 03/30/2017   ? BRBPR (bright red blood per rectum) 02/23/2017     Added automatically from request for surgery [phone]     ? Colon cancer screening 02/23/2017     Added automatically from request for surgery [phone]     ? History of anemia 02/23/2017     Added automatically from request for surgery [phone]     ? Tobacco abuse 02/23/2017     - At least 50 pk years, quit 10/2016     ? Dysphagia 02/23/2017     Added automatically from request for surgery [phone]     ? Gastroesophageal reflux disease with esophagitis 02/22/2017   ? 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 Bad breath 02/22/2017   ? Bright red rectal bleeding 02/22/2017   ? Other dysphagia 02/22/2017   ? Prediabetes 11/25/2016   ? Atrial tachycardia (HCC) 11/24/2016   ? 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 Bradycardia 05/16/2013   ? Right carotid bruit 05/16/2013   ? Dyspnea 03/01/2011   ? History of tobacco use 03/01/2011   ? PAC (premature atrial contraction) 04/08/2010   ? Abnormal cardiovascular stress test 11/12/2008   ? CAD (coronary artery disease) 11/12/2008     11/12/08: Cath - Ostial left main stenosis of 20-30%. Proximal LAD stenosis of 30-40%. Diagonal ostial stenosis of 60%. Mid circumflex with 30-40% stenosis. Mid RCA with a 30-40% stenosis. Medical management.    11/23/16: STEMI/Cath - Culprit lesion, subtotal 99% proximal RCA acute thrombotic lesion s/p PCI with a 3.5 x 33 mm Xience Alpine DES. Ostial 20-30% left main lesion, which appears angiographically unchanged compared to the previous films.       ? HTN (hypertension) 11/12/2008   ? HLD (hyperlipidemia) 11/12/2008   ? Hypothyroidism 11/12/2008         Review of Systems   Constitutional: Negative.   HENT: Negative.    Eyes: Negative.    Cardiovascular: Negative.    Respiratory: Negative.    Endocrine: Negative.    Hematologic/Lymphatic: Negative.    Skin: Negative.    Musculoskeletal: Negative.    Gastrointestinal: Negative.    Genitourinary: Negative.    Neurological: Negative.    Psychiatric/Behavioral: Negative. Allergic/Immunologic: Negative.        Physical Exam  General Appearance: normal in appearance  Skin: warm, moist, no ulcers or xanthomas  Eyes: conjunctivae and lids normal, pupils are equal and round  Lips & Oral Mucosa: no pallor or cyanosis  Neck Veins: neck veins are flat, neck veins are not distended  Chest Inspection: chest is normal in appearance  Respiratory Effort: breathing comfortably, no respiratory distress  Auscultation/Percussion: lungs clear to auscultation,  no rales or rhonchi, no wheezing  Cardiac Rhythm: regular rhythm and normal rate  Cardiac Auscultation: S1, S2 normal, no rub, no gallop  Murmurs: no murmur  Carotid Arteries: normal carotid upstroke bilaterally, R bruit  Lower Extremity Edema: no lower extremity edema  Abdominal Exam: soft, non-tender, no masses, bowel sounds normal  Liver & Spleen: no organomegaly  Language and Memory: patient responsive and seems to comprehend information  Neurologic Exam: neurological assessment grossly intact      Cardiovascular Studies      Cardiovascular Health Factors  Vitals BP Readings from Last 3 Encounters:   09/10/20 134/68   03/06/20 (!) 143/76   02/25/20 (!) 148/76     Wt Readings from Last 3 Encounters:   09/10/20 91.2 kg (201 lb)   09/01/20 90.7 kg (200 lb)   03/06/20 86.2 kg (190 lb)     BMI Readings from Last 3 Encounters:   09/10/20 27.26 kg/m?   09/01/20 27.12 kg/m?   03/06/20 25.77 kg/m?      Smoking Social History     Tobacco Use   Smoking Status Current Every Day Smoker   ? Years: 50.00   ? Types: Cigars, Pipe   Smokeless Tobacco Former Neurosurgeon   ? Types: Chew   ? Quit date: 11/23/2016   Tobacco Comment    smokes pipe and chews tobacco daily      Lipid Profile Cholesterol   Date Value Ref Range Status   02/19/2019 140  Final     HDL   Date Value Ref Range Status   02/19/2019 61  Final     LDL   Date Value Ref Range Status   02/19/2019 66  Final     Triglycerides   Date Value Ref Range Status   02/19/2019 65  Final      Blood Sugar Hemoglobin A1C   Date Value Ref Range Status   11/23/2016 6.2 (H) 4.0 - 6.0 % Final     Comment:     The ADA recommends that most patients with type 1 and type 2 diabetes maintain   an A1c level <7%.       Glucose   Date Value Ref Range Status   02/19/2019 94  Final   07/26/2017 92  Final   11/25/2016 102 (H) 70 - 100 MG/DL Final          Problems Addressed Today  Encounter Diagnoses   Name Primary?   ? Atrial tachycardia (HCC) Yes   ? Bilateral carotid artery stenosis    ? Coronary artery disease involving native coronary artery of native heart without angina pectoris    ? Pure hypercholesterolemia    ? Primary hypertension    ? Hypotension due to drugs    ? PAC (premature atrial contraction)    ? ST elevation myocardial infarction involving right coronary artery (HCC)    ? Abnormal Holter exam    ? ACE-inhibitor cough    ? Chronic obstructive pulmonary disease, unspecified COPD type (HCC)    ? COVID-19 vaccine administered    ? OSA (obstructive sleep apnea)        Assessment and Plan     In summary: This is a 80 year old white male with the following cardiovascular issues:    1. Nonobstructive carotid artery disease  ? Patient does have a right carotid bruit  ? Patient is known to have moderate stenosis of the right carotid artery  ? Has not experienced any symptoms of amaurosis fugax,  no TIA-like symptoms    2. Stable ischemic heart disease  ? Patient does have a history of ST elevation MI, s/p PCI of the proximal to mid RCA in March 2018  ? Patient has not had any recurrent symptoms of angina  ? The most recent perfusion imaging study performed in June 2020 did not demonstrate ischemia  ? During the study and the EKG demonstrated sinus bradycardia and junctional rhythm, patient was asymptomatic    3. Primary hypertension  ? Patient is currently on losartan 25 mg p.o. daily  ? He does have a history of ACE inhibitor induced cough    4. Hyperlipidemia  ? Patient is on statin therapy    5. Known history of asymptomatic bradycardia  ? Patient has not experienced any presyncope or syncope, there is no evidence of high degree AV block    Plan:    1. Continue all current medications  2. Patient will be evaluated with a carotid artery duplex in approximately 6 months in our [facility] office, that will be done in conjunction with an office visit with me  3. Continue risk factors modification    Total Time Today was 30 minutes in the following activities: Preparing to see the patient, Obtaining and/or reviewing separately obtained history, Performing a medically appropriate examination and/or evaluation, Counseling and educating the patient/family/caregiver, Ordering medications, tests, or procedures, Referring and communication with other health care professionals (when not separately reported), Documenting clinical information in the electronic or other health record, Independently interpreting results (not separately reported) and communicating results to the patient/family/caregiver and Care coordination (not separately reported)           Current Medications (including today's revisions)  ? albuterol sulfate (PROAIR HFA) 90 mcg/actuation aerosol inhaler Inhale two puffs by mouth into the lungs every 6 hours as needed for Wheezing or Shortness of Breath. Shake well before use.   ? ascorbic acid (VITAMIN C) 500 mg tablet Take 500 mg by mouth daily.   ? Aspirin 81 mg Tab Take 1 Tab by mouth Daily.   ? Calcium Carbonate (CORAL CALCIUM) 1,000 mg tab Take 1 tablet by mouth daily.   ? cholecalciferol (VITAMIN D-3) 50 mcg (2,000 unit) tablet Take 2,000 Units by mouth daily.   ? cyanocobalamin (VITAMIN B-12) 1,000 mcg tablet Take 1,000 mcg by mouth daily.   ? fluticasone propionate (FLONASE) 50 mcg/actuation nasal spray, suspension Apply two sprays to each nostril as directed daily. Shake bottle gently before using.   ? folic acid/multivit-min/lutein (CENTRUM SILVER PO) Take 1 tablet by mouth daily.   ? Ginkgo Biloba 60 mg cap Take 2 capsules by mouth daily.   ? glucosamine HCl/chondroitin su (GLUCOSAMINE-CHONDROITIN PO) Take 1 tablet by mouth twice daily.   ? krill-om-3-dha-epa-phospho-ast (MEGARED OMEGA-3 KRILL OIL) 300-90-24-50 mg cap Take 1 capsule by mouth daily.   ? lansoprazole(+) DR (PREVACID) 15 mg capsule Take 15 mg by mouth daily 30 minutes before breakfast.   ? levothyroxine (SYNTHROID) 112 mcg tablet TAKE 1 TABLET DAILY 30 MINUTES BEFORE BREAKFAST   ? losartan (COZAAR) 25 mg tablet Take one tablet by mouth twice daily.   ? nitroglycerin (NITROSTAT) 0.4 mg tablet Place one tablet under tongue every 5 minutes as needed for Chest Pain.   ? Potassium Gluconate 595 mg (99 mg) tab Take 1 tablet by mouth daily.   ? pyridoxine (vitamin B6) (VITAMIN B-6) 100 mg tablet Take 100 mg by mouth daily.   ? rosuvastatin (CRESTOR) 20 mg tablet TAKE  1 TABLET DAILY   ? selenium 200 mcg cap Take 1 capsule by mouth daily.   ? sertraline (ZOLOFT) 50 mg tablet Take 25 mg by mouth daily.   ? tadalafiL (CIALIS) 10 mg tablet Take one tablet by mouth as Needed for Erectile dysfunction.   ? tiotropium bromide (SPIRIVA RESPIMAT) 2.5 mcg/actuation inhaler Inhale two puffs by mouth into the lungs daily.   ? zolpidem (AMBIEN) 5 mg tablet Take one-half tablet by mouth at bedtime daily.

## 2020-09-10 NOTE — Patient Instructions
Carotid Duplex    Follow up in 6 months

## 2020-09-21 ENCOUNTER — Encounter: Admit: 2020-09-21 | Discharge: 2020-09-21 | Payer: MEDICARE

## 2020-09-30 ENCOUNTER — Encounter: Admit: 2020-09-30 | Discharge: 2020-09-30 | Payer: MEDICARE

## 2020-10-14 ENCOUNTER — Encounter

## 2020-10-14 DIAGNOSIS — R9431 Abnormal electrocardiogram [ECG] [EKG]: Secondary | ICD-10-CM

## 2020-10-14 DIAGNOSIS — Z23 Encounter for immunization: Secondary | ICD-10-CM

## 2020-10-14 DIAGNOSIS — I2510 Atherosclerotic heart disease of native coronary artery without angina pectoris: Secondary | ICD-10-CM

## 2020-10-14 DIAGNOSIS — I471 Supraventricular tachycardia: Secondary | ICD-10-CM

## 2020-10-14 DIAGNOSIS — E7800 Pure hypercholesterolemia, unspecified: Secondary | ICD-10-CM

## 2020-10-14 DIAGNOSIS — I952 Hypotension due to drugs: Secondary | ICD-10-CM

## 2020-10-14 DIAGNOSIS — G4733 Obstructive sleep apnea (adult) (pediatric): Secondary | ICD-10-CM

## 2020-10-14 DIAGNOSIS — I10 Essential (primary) hypertension: Secondary | ICD-10-CM

## 2020-10-14 DIAGNOSIS — I6523 Occlusion and stenosis of bilateral carotid arteries: Secondary | ICD-10-CM

## 2020-10-14 DIAGNOSIS — I491 Atrial premature depolarization: Secondary | ICD-10-CM

## 2020-10-14 DIAGNOSIS — I2111 ST elevation (STEMI) myocardial infarction involving right coronary artery: Secondary | ICD-10-CM

## 2020-10-14 DIAGNOSIS — J449 Chronic obstructive pulmonary disease, unspecified: Secondary | ICD-10-CM

## 2020-10-14 DIAGNOSIS — R058 ACE-inhibitor cough: Secondary | ICD-10-CM

## 2020-10-26 ENCOUNTER — Encounter: Admit: 2020-10-26 | Discharge: 2020-10-26 | Payer: MEDICARE

## 2020-10-26 NOTE — Telephone Encounter
Results and recommendations called to patient. Patient has no questions at this time.

## 2020-10-26 NOTE — Telephone Encounter
-----   Message from Dorris Fetch, MD sent at 10/24/2020  5:54 PM CST -----  Please let this patient know that the carotid duplex showed moderate blockage, it has not changed since May 2020.  I suggest to continue all present medications.  Follow-up in 6 months from the last office visit.Thank you  ----- Message -----  From: Levora Angel, MD  Sent: 10/14/2020   2:40 PM CST  To: Dorris Fetch, MD

## 2020-11-30 ENCOUNTER — Encounter: Admit: 2020-11-30 | Discharge: 2020-11-30 | Payer: MEDICARE

## 2020-11-30 DIAGNOSIS — I10 Essential (primary) hypertension: Secondary | ICD-10-CM

## 2020-11-30 DIAGNOSIS — E7800 Pure hypercholesterolemia, unspecified: Secondary | ICD-10-CM

## 2020-11-30 DIAGNOSIS — I2510 Atherosclerotic heart disease of native coronary artery without angina pectoris: Secondary | ICD-10-CM

## 2020-11-30 DIAGNOSIS — I6523 Occlusion and stenosis of bilateral carotid arteries: Secondary | ICD-10-CM

## 2020-11-30 DIAGNOSIS — R9439 Abnormal result of other cardiovascular function study: Secondary | ICD-10-CM

## 2020-11-30 MED ORDER — TADALAFIL 10 MG PO TAB
10 mg | ORAL_TABLET | ORAL | 3 refills | Status: AC | PRN
Start: 2020-11-30 — End: ?

## 2020-11-30 MED ORDER — LEVOTHYROXINE 112 MCG PO TAB
112 ug | ORAL_TABLET | Freq: Every day | ORAL | 3 refills | 30.00000 days | Status: AC
Start: 2020-11-30 — End: ?

## 2020-11-30 MED ORDER — LOSARTAN 25 MG PO TAB
25 mg | ORAL_TABLET | Freq: Two times a day (BID) | ORAL | 3 refills | 30.00000 days | Status: AC
Start: 2020-11-30 — End: ?

## 2020-11-30 MED ORDER — ROSUVASTATIN 20 MG PO TAB
20 mg | ORAL_TABLET | Freq: Every day | ORAL | 3 refills | 90.00000 days | Status: AC
Start: 2020-11-30 — End: ?

## 2020-11-30 MED ORDER — FLUTICASONE PROPIONATE 50 MCG/ACTUATION NA SPSN
2 | Freq: Every day | NASAL | 3 refills | 60.00000 days | Status: AC
Start: 2020-11-30 — End: ?

## 2020-11-30 NOTE — Progress Notes
From: Dorris Fetch, MD   Sent: 09/10/2020  5:22 PM CST   To: Cvm Nurse [place]     Please reschedule this patient for a carotid artery duplex and follow-up office visit with me in approximately 6 months in [place] office.    Thank you

## 2020-12-03 ENCOUNTER — Encounter: Admit: 2020-12-03 | Discharge: 2020-12-03 | Payer: MEDICARE

## 2020-12-18 ENCOUNTER — Encounter: Admit: 2020-12-18 | Discharge: 2020-12-18 | Payer: MEDICARE

## 2020-12-18 NOTE — Telephone Encounter
RN prepped patient information for clinic with Michelle McLean APRN NP on 12/28/20 via in person clinic.

## 2020-12-23 NOTE — Patient Instructions
-   If you need to schedule or reschedule an appointment, please call [phone].     - For any urgent issues after business hours, please call [phone] and have the on call pulmonary physician paged.    - If you have any questions regarding your appointment or any concerns, please contact my pulmonary care nurse at  [phone]    -Refills on medications, please have your pharmacy fax a refill authorization request form to our office at Fax) [phone].     Please allow at least 3 business days for refill requests.

## 2020-12-28 ENCOUNTER — Ambulatory Visit: Admit: 2020-12-28 | Discharge: 2020-12-29 | Payer: MEDICARE

## 2020-12-28 ENCOUNTER — Encounter: Admit: 2020-12-28 | Discharge: 2020-12-28 | Payer: MEDICARE

## 2020-12-28 DIAGNOSIS — K219 Gastro-esophageal reflux disease without esophagitis: Secondary | ICD-10-CM

## 2020-12-28 DIAGNOSIS — I491 Atrial premature depolarization: Secondary | ICD-10-CM

## 2020-12-28 DIAGNOSIS — G4733 Obstructive sleep apnea (adult) (pediatric): Secondary | ICD-10-CM

## 2020-12-28 DIAGNOSIS — I10 Essential (primary) hypertension: Secondary | ICD-10-CM

## 2020-12-28 DIAGNOSIS — E039 Hypothyroidism, unspecified: Secondary | ICD-10-CM

## 2020-12-28 DIAGNOSIS — J302 Other seasonal allergic rhinitis: Secondary | ICD-10-CM

## 2020-12-28 DIAGNOSIS — J189 Pneumonia, unspecified organism: Secondary | ICD-10-CM

## 2020-12-28 DIAGNOSIS — E785 Hyperlipidemia, unspecified: Secondary | ICD-10-CM

## 2020-12-28 DIAGNOSIS — I00 Rheumatic fever without heart involvement: Secondary | ICD-10-CM

## 2020-12-28 DIAGNOSIS — I219 Acute myocardial infarction, unspecified: Secondary | ICD-10-CM

## 2020-12-28 DIAGNOSIS — I2510 Atherosclerotic heart disease of native coronary artery without angina pectoris: Secondary | ICD-10-CM

## 2020-12-29 ENCOUNTER — Encounter: Admit: 2020-12-29 | Discharge: 2020-12-29 | Payer: MEDICARE

## 2020-12-29 DIAGNOSIS — I2510 Atherosclerotic heart disease of native coronary artery without angina pectoris: Secondary | ICD-10-CM

## 2020-12-29 DIAGNOSIS — E039 Hypothyroidism, unspecified: Secondary | ICD-10-CM

## 2020-12-29 DIAGNOSIS — I491 Atrial premature depolarization: Secondary | ICD-10-CM

## 2020-12-29 DIAGNOSIS — K219 Gastro-esophageal reflux disease without esophagitis: Secondary | ICD-10-CM

## 2020-12-29 DIAGNOSIS — E785 Hyperlipidemia, unspecified: Secondary | ICD-10-CM

## 2020-12-29 DIAGNOSIS — J302 Other seasonal allergic rhinitis: Secondary | ICD-10-CM

## 2020-12-29 DIAGNOSIS — I10 Essential (primary) hypertension: Secondary | ICD-10-CM

## 2020-12-29 DIAGNOSIS — I219 Acute myocardial infarction, unspecified: Secondary | ICD-10-CM

## 2020-12-29 DIAGNOSIS — I00 Rheumatic fever without heart involvement: Secondary | ICD-10-CM

## 2020-12-29 DIAGNOSIS — J189 Pneumonia, unspecified organism: Secondary | ICD-10-CM

## 2021-03-09 ENCOUNTER — Encounter: Admit: 2021-03-09 | Discharge: 2021-03-09 | Payer: MEDICARE

## 2021-03-11 ENCOUNTER — Encounter: Admit: 2021-03-11 | Discharge: 2021-03-11 | Payer: MEDICARE

## 2021-03-11 DIAGNOSIS — I491 Atrial premature depolarization: Secondary | ICD-10-CM

## 2021-03-11 DIAGNOSIS — I2111 ST elevation (STEMI) myocardial infarction involving right coronary artery: Secondary | ICD-10-CM

## 2021-03-11 DIAGNOSIS — Z87891 Personal history of nicotine dependence: Secondary | ICD-10-CM

## 2021-03-11 DIAGNOSIS — G4733 Obstructive sleep apnea (adult) (pediatric): Secondary | ICD-10-CM

## 2021-03-11 DIAGNOSIS — I6523 Occlusion and stenosis of bilateral carotid arteries: Secondary | ICD-10-CM

## 2021-03-11 DIAGNOSIS — I10 Essential (primary) hypertension: Secondary | ICD-10-CM

## 2021-03-11 DIAGNOSIS — I2510 Atherosclerotic heart disease of native coronary artery without angina pectoris: Secondary | ICD-10-CM

## 2021-03-11 DIAGNOSIS — E039 Hypothyroidism, unspecified: Secondary | ICD-10-CM

## 2021-03-11 DIAGNOSIS — J302 Other seasonal allergic rhinitis: Secondary | ICD-10-CM

## 2021-03-11 DIAGNOSIS — E7800 Pure hypercholesterolemia, unspecified: Secondary | ICD-10-CM

## 2021-03-11 DIAGNOSIS — R058 ACE-inhibitor cough: Secondary | ICD-10-CM

## 2021-03-11 DIAGNOSIS — E785 Hyperlipidemia, unspecified: Secondary | ICD-10-CM

## 2021-03-11 DIAGNOSIS — I952 Hypotension due to drugs: Secondary | ICD-10-CM

## 2021-03-11 DIAGNOSIS — I219 Acute myocardial infarction, unspecified: Secondary | ICD-10-CM

## 2021-03-11 DIAGNOSIS — J189 Pneumonia, unspecified organism: Secondary | ICD-10-CM

## 2021-03-11 DIAGNOSIS — K219 Gastro-esophageal reflux disease without esophagitis: Secondary | ICD-10-CM

## 2021-03-11 DIAGNOSIS — Z955 Presence of coronary angioplasty implant and graft: Secondary | ICD-10-CM

## 2021-03-11 DIAGNOSIS — R9431 Abnormal electrocardiogram [ECG] [EKG]: Secondary | ICD-10-CM

## 2021-03-11 DIAGNOSIS — R001 Bradycardia, unspecified: Secondary | ICD-10-CM

## 2021-03-11 DIAGNOSIS — I00 Rheumatic fever without heart involvement: Secondary | ICD-10-CM

## 2021-03-11 DIAGNOSIS — I471 Supraventricular tachycardia: Secondary | ICD-10-CM

## 2021-03-11 MED ORDER — ROSUVASTATIN 20 MG PO TAB
20 mg | ORAL_TABLET | Freq: Every day | ORAL | 3 refills | 90.00000 days | Status: AC
Start: 2021-03-11 — End: ?

## 2021-03-11 MED ORDER — TADALAFIL (PULM. HYPERTENSION) 20 MG PO TAB
20 mg | ORAL_TABLET | Freq: Every day | ORAL | 3 refills | Status: AC
Start: 2021-03-11 — End: ?

## 2021-03-11 MED ORDER — LOSARTAN 25 MG PO TAB
25 mg | ORAL_TABLET | Freq: Two times a day (BID) | ORAL | 3 refills | 30.00000 days | Status: AC
Start: 2021-03-11 — End: ?

## 2021-03-11 NOTE — Progress Notes
Date of Service: 03/11/2021    Jack Wagner is a 80 y.o. male.       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  Patient has not experienced any symptoms of chest pain, no angina has not used any nitroglycerin.    Patient was evaluated was evaluated with the following test in the recent past:    ? Carotid artery duplex dated 10/14/2020- demonstrated moderate stenosis of the right internal mid RCA, no hemodynamically significant stenosis in bilateral common and left internal carotid artery, mild atheromatous plaque visualized in bilateral common and internal carotid arteries, antegrade flow in vertebral arteries.  ? 2D echo Doppler study performed in July 2021-normal LVEF, normal diastolic function, normal right ventricular size and function, no significant valvular abnormalities, PAP = 32 mmHg  ? Long-term Holter monitor performed in June 2021-normal sinus rhythm, no evidence of high degree AV block and no evidence of atrial arrhythmias.         Vitals:    03/11/21 0824 03/11/21 0831   BP: 130/76 134/74   BP Source: Arm, Left Upper Arm, Right Upper   Pulse: 58    SpO2: 97%    O2 Percent: 97 %    O2 Device: None (Room air)    PainSc: Zero    Weight: 84.6 kg (186 lb 6.4 oz)    Height: 182.9 cm (6')      Body mass index is 25.28 kg/m?Marland Kitchen     Past Medical History  Patient Active Problem List    Diagnosis Date Noted   ? COVID-19 vaccine administered 02/25/2020   ? Bradycardia 02/25/2020   ? PAC (premature atrial contraction) 02/25/2020   ? OSA (obstructive sleep apnea) 05/09/2019     Home sleep study done on 72,020 showed moderate sleep apnea with AHI of 22.7.  Pt set up with CPAP @ 5-15cm H2O on 05/09/19.  DME: [facility]  Insurance: Medicare  If the patient has to meet compliance, their compliance window will be from 06/09/19 to 08/07/19. met     ? Abnormal Holter exam 04/30/2019   ? Fall 12/25/2018   ? Bilateral carotid artery stenosis 12/25/2018   ? Stented coronary artery 05/24/2018   ? Cough 10/03/2017   ? ACE-inhibitor cough 10/03/2017   ? Snoring 05/29/2017     - Witnessed apneas and snoring  - ESS 10     ? COPD (chronic obstructive pulmonary disease) (HCC) 05/29/2017     GOLD Staging: A1  mMRC: 1  GOLD classification of airflow obstruction: 1  Number exacerbations in past year: 0  PFT (05/29/2017)  FVC 4.49L (101% pred)  FEV1 2.65L (82% pred)  FEV1/FVC 59%  RV 2.98L (114% pred)  DLCO 94% pred  Inhaler Regimen  Spiriva 2 puffs once a day  Vaccinations  Influenza - Refused  Pneumococcal - Will need to check with PCP to ensure this has occured  Prevnar - Will need to check with PCP to ensure this has occured  Pulm Rehab   Currently performing cardiac rehab  Smoking  40 pk year, quit 10/2016  LDCT screening  Recommended to patient, he would like to consider and discuss further at upcoming appointment     ? Dysgeusia 04/12/2017     Added automatically from request for surgery [phone]     ?  Weight loss 03/30/2017   ? Hypotension due to drugs 03/30/2017   ? BRBPR (bright red blood per rectum) 02/23/2017     Added automatically from request for surgery [phone]     ? Colon cancer screening 02/23/2017     Added automatically from request for surgery [phone]     ? History of anemia 02/23/2017     Added automatically from request for surgery [phone]     ? Tobacco abuse 02/23/2017     - At least 50 pk years, quit 10/2016     ? Dysphagia 02/23/2017     Added automatically from request for surgery [phone]     ? Gastroesophageal reflux disease with esophagitis 02/22/2017   ? 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 Bad breath 02/22/2017   ? Bright red rectal bleeding 02/22/2017   ? Other dysphagia 02/22/2017   ? Prediabetes 11/25/2016   ? Atrial tachycardia (HCC) 11/24/2016   ? 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 Bradycardia 05/16/2013   ? Renal bruit 05/16/2013   ? Dyspnea 03/01/2011   ? History of tobacco use 03/01/2011   ? PAC (premature atrial contraction) 04/08/2010   ? Abnormal cardiovascular stress test 11/12/2008   ? CAD (coronary artery disease) 11/12/2008     11/12/08: Cath - Ostial left main stenosis of 20-30%. Proximal LAD stenosis of 30-40%. Diagonal ostial stenosis of 60%. Mid circumflex with 30-40% stenosis. Mid RCA with a 30-40% stenosis. Medical management.    11/23/16: STEMI/Cath - Culprit lesion, subtotal 99% proximal RCA acute thrombotic lesion s/p PCI with a 3.5 x 33 mm Xience Alpine DES. Ostial 20-30% left main lesion, which appears angiographically unchanged compared to the previous films.       ? HTN (hypertension) 11/12/2008   ? HLD (hyperlipidemia) 11/12/2008   ? Hypothyroidism 11/12/2008         Review of Systems   Constitutional: Negative.   HENT: Positive for hearing loss and tinnitus.    Eyes: Positive for blurred vision.   Cardiovascular: Positive for irregular heartbeat.   Respiratory: Positive for cough and shortness of breath.    Endocrine: Negative.    Hematologic/Lymphatic: Negative.    Skin: Positive for nail changes.   Musculoskeletal: Positive for back pain.   Gastrointestinal: Negative.    Genitourinary: Positive for decreased libido.   Neurological: Negative.    Psychiatric/Behavioral: Positive for depression.   Allergic/Immunologic: Negative.        Physical Exam     General Appearance: normal in appearance  Skin: warm, moist, no ulcers or xanthomas  Eyes: conjunctivae and lids normal, pupils are equal and round  Lips & Oral Mucosa: no pallor or cyanosis  Neck Veins: neck veins are flat, neck veins are not distended  Chest Inspection: chest is normal in appearance  Respiratory Effort: breathing comfortably, no respiratory distress  Auscultation/Percussion: lungs clear to auscultation, no rales or rhonchi, no  wheezing  Cardiac Rhythm: regular rhythm and normal rate  Cardiac Auscultation: S1, S2 normal, no rub, no gallop  Murmurs: no murmur  Carotid Arteries: normal carotid upstroke bilaterally, R bruit  Lower Extremity Edema: no lower extremity edema  Abdominal Exam: soft, non-tender, no masses, bowel sounds normal  Liver & Spleen: no organomegaly  Language and Memory: patient responsive and seems to comprehend information  Neurologic Exam: neurological assessment grossly intact    Cardiovascular Studies  Twelve-lead EKG demonstrates sinus bradycardia, ventricular rate 51 bpm, no axis deviation, 1 PAC is present.    Cardiovascular Health Factors  Vitals BP Readings from Last 3 Encounters:   03/11/21 134/74   12/28/20 139/63   10/14/20 (!) 153/75     Wt Readings from Last 3 Encounters:   03/11/21 84.6 kg (186 lb 6.4 oz)   12/28/20 89.8 kg (198 lb)   10/14/20 93.5 kg (206 lb 3.2 oz)     BMI Readings from Last 3 Encounters:   03/11/21 25.28 kg/m?   12/28/20 26.85 kg/m?   10/14/20 27.97 kg/m?      Smoking Social History     Tobacco Use   Smoking Status Current Every Day Smoker   ? Years: 50.00   ? Types: Cigars, Pipe   Smokeless Tobacco Former Neurosurgeon   ? Types: Chew   ? Quit date: 11/23/2016   Tobacco Comment    smokes pipe and chews tobacco daily      Lipid Profile Cholesterol   Date Value Ref Range Status   03/06/2020 130  Final     HDL   Date Value Ref Range Status   03/06/2020 45  Final     LDL   Date Value Ref Range Status   03/06/2020 65  Final     Triglycerides   Date Value Ref Range Status   03/06/2020 100  Final      Blood Sugar Hemoglobin A1C   Date Value Ref Range Status   11/23/2016 6.2 (H) 4.0 - 6.0 % Final     Comment:     The ADA recommends that most patients with type 1 and type 2 diabetes maintain   an A1c level <7%.       Glucose   Date Value Ref Range Status   06/16/2020 163 (H) 70 - 105 Final   02/19/2019 94  Final   07/26/2017 92  Final          Problems Addressed Today  Encounter Diagnoses   Name Primary?   ? Atrial tachycardia (HCC) Yes   ? Bilateral carotid artery stenosis    ? Coronary artery disease involving native coronary artery of native heart without angina pectoris    ? Pure hypercholesterolemia    ? Primary hypertension    ? Hypotension due to drugs    ? PAC (premature atrial contraction)    ? ST elevation myocardial infarction involving right coronary artery (HCC)    ? Abnormal Holter exam    ? ACE-inhibitor cough    ? History of tobacco use    ? OSA (obstructive sleep apnea)    ? Stented coronary artery    ? Bradycardia        Assessment and Plan     Assessment:    1.  Asymptomatic and nonobstructive carotid artery disease-patient was evaluated with a carotid artery duplex in February 2022 and he was found to have mild to moderate disease in the right ICA.  2.  Coronary artery disease-no symptoms  of angina and no nitroglycerin use  3.  History of inferior wall ST elevation MI-patient did undergo PCI of the proximal-mid RCA in March 2018.  The most recent perfusion imaging study performed in June 2020 did not demonstrate ischemia, ejection fraction was 68%, on that study patient did develop bradycardia and junctional rhythm, it was no evidence of high degree AV block and he remained hemodynamically stable.  4.  Primary hypertension-patient is on losartan, his blood pressure is under good control  5.  Hyperlipidemia-on statin therapy  6.  Asymptomatic bradycardia-on the perfusion imaging study performed in June 2020 patient developed bradycardia and junctional rhythm, he remained asymptomatic and hemodynamically stable, a Holter monitor evaluation performed in June 2021 did not demonstrate any significant bradycardia arrhythmias    Plan:    1.  Continue all current medications  2.  I did recommend risk factors modifications  3.  I also renewed the prescriptions for losartan, rosuvastatin and Cialis  6.  Follow-up office visit in February - March 2023, next year we will repeat a carotid artery duplex and a perfusion imaging study.      Total Time Today was 35 minutes in the following activities: Preparing to see the patient, Obtaining and/or reviewing separately obtained history, Performing a medically appropriate examination and/or evaluation, Counseling and educating the patient/family/caregiver, Ordering medications, tests, or procedures, Referring and communication with other health care professionals (when not separately reported), Documenting clinical information in the electronic or other health record, Independently interpreting results (not separately reported) and communicating results to the patient/family/caregiver and Care coordination (not separately reported)         Current Medications (including today's revisions)  ? albuterol sulfate (PROAIR HFA) 90 mcg/actuation aerosol inhaler Inhale two puffs by mouth into the lungs every 6 hours as needed for Wheezing or Shortness of Breath. Shake well before use.   ? ascorbic acid (VITAMIN C) 500 mg tablet Take 500 mg by mouth daily.   ? Aspirin 81 mg Tab Take 1 Tab by mouth Daily.   ? Calcium Carbonate (CORAL CALCIUM) 1,000 mg tab Take 1 tablet by mouth daily.   ? cholecalciferol (VITAMIN D3) 50 mcg (2,000 unit) tablet Take 2,000 Units by mouth daily.   ? cyanocobalamin 1,000 mcg tablet Take 1,000 mcg by mouth daily.   ? fluticasone propionate (FLONASE) 50 mcg/actuation nasal spray, suspension Apply two sprays to each nostril as directed daily. Shake bottle gently before using.   ? folic acid/multivit-min/lutein (CENTRUM SILVER PO) Take 1 tablet by mouth daily.   ? Ginkgo Biloba 60 mg cap Take 2 capsules by mouth daily.   ? glucosamine HCl/chondroitin su (GLUCOSAMINE-CHONDROITIN PO) Take 1 tablet by mouth twice daily.   ? krill-om-3-dha-epa-phospho-ast 300-90-24-50 mg cap Take 1 capsule by mouth daily.   ? lansoprazole(+) DR (PREVACID) 15 mg capsule Take 15 mg by mouth daily 30 minutes before breakfast.   ? levothyroxine (SYNTHROID) 112 mcg tablet Take one tablet by mouth daily 30 minutes before breakfast.   ? losartan (COZAAR) 25 mg tablet Take one tablet by mouth twice daily.   ? magnesium oxide (MAGOX) 400 mg (241.3 mg magnesium) tablet Take 400 mg by mouth daily.   ? nitroglycerin (NITROSTAT) 0.4 mg tablet Place one tablet under tongue every 5 minutes as needed for Chest Pain.   ? Potassium Gluconate 595 mg (99 mg) tab Take 1 tablet by mouth daily.   ? pyridoxine (vitamin B6) 100 mg tablet Take 100 mg by mouth daily.   ?  rosuvastatin (CRESTOR) 20 mg tablet Take one tablet by mouth daily.   ? selenium 200 mcg cap Take 1 capsule by mouth daily.   ? sertraline (ZOLOFT) 50 mg tablet Take 25 mg by mouth daily.   ? tadalafiL (CIALIS) 10 mg tablet Take one tablet by mouth as Needed for Erectile dysfunction.   ? tiotropium bromide (SPIRIVA RESPIMAT) 2.5 mcg/actuation inhaler Inhale two puffs by mouth into the lungs daily.

## 2021-06-25 ENCOUNTER — Encounter: Admit: 2021-06-25 | Discharge: 2021-06-25 | Payer: MEDICARE

## 2021-07-28 ENCOUNTER — Encounter: Admit: 2021-07-28 | Discharge: 2021-07-28 | Payer: MEDICARE

## 2021-09-10 ENCOUNTER — Encounter: Admit: 2021-09-10 | Discharge: 2021-09-10 | Payer: MEDICARE

## 2021-09-10 NOTE — Telephone Encounter
Spoke with patient regarding Regadenoson stress test scheduled for 1/18. Patient states he needs to reschedule his appointments scheduled in [place] for Wednesday. Provided the scheduling number for the patient to reschedule his testing.

## 2021-10-01 ENCOUNTER — Encounter: Admit: 2021-10-01 | Discharge: 2021-10-01 | Payer: MEDICARE

## 2021-11-01 ENCOUNTER — Ambulatory Visit: Admit: 2021-11-01 | Discharge: 2021-11-01 | Payer: MEDICARE

## 2021-11-01 ENCOUNTER — Encounter: Admit: 2021-11-01 | Discharge: 2021-11-01 | Payer: MEDICARE

## 2021-11-01 DIAGNOSIS — I2510 Atherosclerotic heart disease of native coronary artery without angina pectoris: Secondary | ICD-10-CM

## 2021-11-01 DIAGNOSIS — I10 Essential (primary) hypertension: Secondary | ICD-10-CM

## 2021-11-01 DIAGNOSIS — E7800 Pure hypercholesterolemia, unspecified: Secondary | ICD-10-CM

## 2021-11-01 DIAGNOSIS — I6523 Occlusion and stenosis of bilateral carotid arteries: Secondary | ICD-10-CM

## 2021-11-01 MED ORDER — ALBUTEROL SULFATE 90 MCG/ACTUATION IN HFAA
2 | RESPIRATORY_TRACT | 0 refills | Status: AC | PRN
Start: 2021-11-01 — End: ?

## 2021-11-01 MED ORDER — RP DX TC-99M TETROFOSMIN MCI
9 | Freq: Once | INTRAVENOUS | 0 refills | Status: CP
Start: 2021-11-01 — End: ?

## 2021-11-01 MED ORDER — SODIUM CHLORIDE 0.9 % IV SOLP
250 mL | INTRAVENOUS | 0 refills | Status: AC | PRN
Start: 2021-11-01 — End: ?

## 2021-11-01 MED ORDER — EUCALYPTUS-MENTHOL MM LOZG
1 | Freq: Once | ORAL | 0 refills | Status: AC | PRN
Start: 2021-11-01 — End: ?

## 2021-11-01 MED ORDER — REGADENOSON 0.4 MG/5 ML IV SYRG
.4 mg | Freq: Once | INTRAVENOUS | 0 refills | Status: CP
Start: 2021-11-01 — End: ?

## 2021-11-01 MED ORDER — AMINOPHYLLINE 25 MG/ML IV SOLN
50 mg | INTRAVENOUS | 0 refills | Status: AC | PRN
Start: 2021-11-01 — End: ?

## 2021-11-01 MED ORDER — NITROGLYCERIN 0.4 MG SL SUBL
.4 mg | SUBLINGUAL | 0 refills | Status: AC | PRN
Start: 2021-11-01 — End: ?

## 2021-11-01 MED ORDER — RP DX TC-99M TETROFOSMIN MCI
26 | Freq: Once | INTRAVENOUS | 0 refills | Status: CP
Start: 2021-11-01 — End: ?

## 2021-11-05 ENCOUNTER — Encounter: Admit: 2021-11-05 | Discharge: 2021-11-05 | Payer: MEDICARE

## 2021-11-10 ENCOUNTER — Encounter: Admit: 2021-11-10 | Discharge: 2021-11-10 | Payer: MEDICARE

## 2021-11-10 NOTE — Telephone Encounter
-----   Message from Dorris Fetch, MD sent at 11/09/2021  8:27 PM CDT -----  Please let the patient know that I have good news for him, the carotid artery duplex did not show any significant blockage, the stress test did not show any new blockages in the heart function was normal.    He does have a follow-up appointment with me on 11/18/2021, if he is doing well, maybe we can see him in 6 months from now, if he wants to come back to the office with any questions or to be seen I will be glad to do that.    Thank you      ----- Message -----  From: Woody Seller, MD  Sent: 11/01/2021  12:14 PM CDT  To: Dorris Fetch, MD

## 2021-11-10 NOTE — Telephone Encounter
Results and recommendations called to patient lmom requested call back if questions

## 2021-11-18 ENCOUNTER — Encounter: Admit: 2021-11-18 | Discharge: 2021-11-18 | Payer: MEDICARE

## 2021-11-18 ENCOUNTER — Ambulatory Visit: Admit: 2021-11-18 | Discharge: 2021-11-19 | Payer: MEDICARE

## 2021-11-18 DIAGNOSIS — I6521 Occlusion and stenosis of right carotid artery: Secondary | ICD-10-CM

## 2021-11-18 DIAGNOSIS — R9431 Abnormal electrocardiogram [ECG] [EKG]: Secondary | ICD-10-CM

## 2021-11-18 DIAGNOSIS — K625 Hemorrhage of anus and rectum: Secondary | ICD-10-CM

## 2021-11-18 DIAGNOSIS — I491 Atrial premature depolarization: Secondary | ICD-10-CM

## 2021-11-18 DIAGNOSIS — I471 Supraventricular tachycardia: Secondary | ICD-10-CM

## 2021-11-18 DIAGNOSIS — J449 Chronic obstructive pulmonary disease, unspecified: Secondary | ICD-10-CM

## 2021-11-18 DIAGNOSIS — K219 Gastro-esophageal reflux disease without esophagitis: Secondary | ICD-10-CM

## 2021-11-18 DIAGNOSIS — R058 ACE-inhibitor cough: Secondary | ICD-10-CM

## 2021-11-18 DIAGNOSIS — I10 Essential (primary) hypertension: Secondary | ICD-10-CM

## 2021-11-18 DIAGNOSIS — I2510 Atherosclerotic heart disease of native coronary artery without angina pectoris: Secondary | ICD-10-CM

## 2021-11-18 DIAGNOSIS — Z955 Presence of coronary angioplasty implant and graft: Secondary | ICD-10-CM

## 2021-11-18 DIAGNOSIS — E7800 Pure hypercholesterolemia, unspecified: Secondary | ICD-10-CM

## 2021-11-18 DIAGNOSIS — G4733 Obstructive sleep apnea (adult) (pediatric): Secondary | ICD-10-CM

## 2021-11-18 DIAGNOSIS — I00 Rheumatic fever without heart involvement: Secondary | ICD-10-CM

## 2021-11-18 DIAGNOSIS — Z87891 Personal history of nicotine dependence: Secondary | ICD-10-CM

## 2021-11-18 DIAGNOSIS — J189 Pneumonia, unspecified organism: Secondary | ICD-10-CM

## 2021-11-18 DIAGNOSIS — I219 Acute myocardial infarction, unspecified: Secondary | ICD-10-CM

## 2021-11-18 DIAGNOSIS — E039 Hypothyroidism, unspecified: Secondary | ICD-10-CM

## 2021-11-18 DIAGNOSIS — E785 Hyperlipidemia, unspecified: Secondary | ICD-10-CM

## 2021-11-18 DIAGNOSIS — R196 Halitosis: Secondary | ICD-10-CM

## 2021-11-18 DIAGNOSIS — J302 Other seasonal allergic rhinitis: Secondary | ICD-10-CM

## 2021-11-18 DIAGNOSIS — I2111 ST elevation (STEMI) myocardial infarction involving right coronary artery: Secondary | ICD-10-CM

## 2021-11-18 MED ORDER — TADALAFIL (PULM. HYPERTENSION) 20 MG PO TAB
20 mg | ORAL_TABLET | Freq: Every day | ORAL | 3 refills
Start: 2021-11-18 — End: ?

## 2021-11-18 MED ORDER — TADALAFIL (PULM. HYPERTENSION) 20 MG PO TAB
20 mg | ORAL_TABLET | Freq: Every day | ORAL | 3 refills | Status: AC
Start: 2021-11-18 — End: ?

## 2021-11-18 MED ORDER — NITROGLYCERIN 0.4 MG SL SUBL
0.4 mg | ORAL_TABLET | SUBLINGUAL | 6 refills | 9.00000 days | Status: AC | PRN
Start: 2021-11-18 — End: ?

## 2021-11-18 MED ORDER — LOSARTAN 50 MG PO TAB
50 mg | ORAL_TABLET | Freq: Every evening | ORAL | 3 refills | 90.00000 days | Status: AC
Start: 2021-11-18 — End: ?

## 2021-11-18 MED ORDER — ROSUVASTATIN 20 MG PO TAB
20 mg | ORAL_TABLET | Freq: Every day | ORAL | 3 refills | 90.00000 days | Status: AC
Start: 2021-11-18 — End: ?

## 2022-05-24 ENCOUNTER — Encounter: Admit: 2022-05-24 | Discharge: 2022-05-24 | Payer: MEDICARE

## 2022-05-24 DIAGNOSIS — K219 Gastro-esophageal reflux disease without esophagitis: Secondary | ICD-10-CM

## 2022-05-24 DIAGNOSIS — I2510 Atherosclerotic heart disease of native coronary artery without angina pectoris: Secondary | ICD-10-CM

## 2022-05-24 DIAGNOSIS — J302 Other seasonal allergic rhinitis: Secondary | ICD-10-CM

## 2022-05-24 DIAGNOSIS — I2111 ST elevation (STEMI) myocardial infarction involving right coronary artery: Secondary | ICD-10-CM

## 2022-05-24 DIAGNOSIS — Z136 Encounter for screening for cardiovascular disorders: Secondary | ICD-10-CM

## 2022-05-24 DIAGNOSIS — I471 Supraventricular tachycardia: Secondary | ICD-10-CM

## 2022-05-24 DIAGNOSIS — E785 Hyperlipidemia, unspecified: Secondary | ICD-10-CM

## 2022-05-24 DIAGNOSIS — E039 Hypothyroidism, unspecified: Secondary | ICD-10-CM

## 2022-05-24 DIAGNOSIS — I219 Acute myocardial infarction, unspecified: Secondary | ICD-10-CM

## 2022-05-24 DIAGNOSIS — J189 Pneumonia, unspecified organism: Secondary | ICD-10-CM

## 2022-05-24 DIAGNOSIS — E7800 Pure hypercholesterolemia, unspecified: Secondary | ICD-10-CM

## 2022-05-24 DIAGNOSIS — I491 Atrial premature depolarization: Secondary | ICD-10-CM

## 2022-05-24 DIAGNOSIS — Z87891 Personal history of nicotine dependence: Secondary | ICD-10-CM

## 2022-05-24 DIAGNOSIS — I10 Essential (primary) hypertension: Secondary | ICD-10-CM

## 2022-05-24 DIAGNOSIS — Z955 Presence of coronary angioplasty implant and graft: Secondary | ICD-10-CM

## 2022-05-24 DIAGNOSIS — R058 ACE-inhibitor cough: Secondary | ICD-10-CM

## 2022-05-24 DIAGNOSIS — I6521 Occlusion and stenosis of right carotid artery: Secondary | ICD-10-CM

## 2022-05-24 DIAGNOSIS — I952 Hypotension due to drugs: Secondary | ICD-10-CM

## 2022-05-24 DIAGNOSIS — I00 Rheumatic fever without heart involvement: Secondary | ICD-10-CM

## 2022-05-24 MED ORDER — LOSARTAN 50 MG PO TAB
50 mg | ORAL_TABLET | Freq: Every evening | ORAL | 3 refills | 90.00000 days | Status: AC
Start: 2022-05-24 — End: ?

## 2022-05-24 MED ORDER — TADALAFIL 20 MG PO TAB
20 mg | ORAL_TABLET | ORAL | 3 refills | Status: AC | PRN
Start: 2022-05-24 — End: ?

## 2022-05-24 MED ORDER — ROSUVASTATIN 20 MG PO TAB
20 mg | ORAL_TABLET | Freq: Every day | ORAL | 3 refills | 90.00000 days | Status: AC
Start: 2022-05-24 — End: ?

## 2022-05-24 NOTE — Progress Notes
Date of Service: 05/24/2022    Jack Wagner is a 81 y.o. male.       HPI     Jack Wagner is a 81 y.o.   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    In March 2023 patient was evaluated with the following:    1.  Myocardial perfusion imaging study-the perfusion pattern was normal, did not show any ischemia, normal LVEF = 66%.  2.  Carotid artery duplex-no hemodynamically significant stenosis measured in the bilateral common and left internal carotid arteries, there are elevated velocities and turbulent flow in the right mid to distal segment of the internal carotid artery, very likely secondary to vessel tortuosity, normal antegrade flow in the bilateral vertebral arteries, no evidence of proximal subclavian       He does not report having symptoms of chest pain, no heart palpitations.  The systolic blood pressure is borderline elevated today we did record 140/70 mmHg, with a stable heart rate of 55 bpm.           Vitals:    05/24/22 1558   BP: (!) 140/70   BP Source: Arm, Left Upper   Pulse: 55   SpO2: 98%   O2 Percent: 98 %   O2 Device: None (Room air)   PainSc: Zero   Weight: 83.3 kg (183 lb 9.6 oz)   Height: 182.9 cm (6')     Body mass index is 24.9 kg/m?Marland Kitchen     Past Medical History  Patient Active Problem List    Diagnosis Date Noted   ? COVID-19 vaccine administered 02/25/2020   ? Bradycardia 02/25/2020   ? PAC (premature atrial contraction) 02/25/2020   ? OSA (obstructive sleep apnea) 05/09/2019     Home sleep study done on 72,020 showed moderate sleep apnea with AHI of 22.7.  Pt set up with CPAP @ 5-15cm H2O on 05/09/19.  DME: [facility]  Insurance: Medicare  If the patient has to meet compliance, their compliance window will be from 06/09/19 to 08/07/19. met     ? Abnormal Holter exam 04/30/2019   ? Fall 12/25/2018   ? Asymptomatic carotid artery stenosis, right 12/25/2018   ? Stented coronary artery 05/24/2018   ? Cough 10/03/2017   ? ACE-inhibitor cough 10/03/2017   ? Snoring 05/29/2017     - Witnessed apneas and snoring  - ESS 10     ? COPD (chronic obstructive pulmonary disease) (HCC) 05/29/2017     GOLD Staging: A1  mMRC: 1  GOLD classification of airflow obstruction: 1  Number exacerbations in past year: 0  PFT (05/29/2017)  FVC 4.49L (101% pred)  FEV1 2.65L (82% pred)  FEV1/FVC 59%  RV 2.98L (114% pred)  DLCO 94% pred  Inhaler Regimen  Spiriva 2 puffs once a day  Vaccinations  Influenza - Refused  Pneumococcal - Will need to check with PCP to ensure this has occured  Prevnar - Will need to check with PCP to ensure this has occured  Pulm Rehab   Currently performing cardiac rehab  Smoking  40 pk year, quit 10/2016  LDCT screening  Recommended to patient, he would like to consider and discuss further at upcoming  appointment     ? Dysgeusia 04/12/2017     Added automatically from request for surgery [phone]     ? Weight loss 03/30/2017   ? Hypotension due to drugs 03/30/2017   ? BRBPR (bright red blood per rectum) 02/23/2017     Added automatically from request for surgery [phone]     ? Colon cancer screening 02/23/2017     Added automatically from request for surgery [id number]     ? History of anemia 02/23/2017     Added automatically from request for surgery [phone]     ? Tobacco abuse 02/23/2017     - At least 50 pk years, quit 10/2016     ? Dysphagia 02/23/2017     Added automatically from request for surgery [phone]     ? Gastroesophageal reflux disease with esophagitis 02/22/2017   ? 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 Bad breath 02/22/2017   ? Bright red rectal bleeding 02/22/2017   ? Other dysphagia 02/22/2017   ? Prediabetes 11/25/2016   ? Atrial tachycardia (HCC) 11/24/2016   ? 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 Bradycardia 05/16/2013   ? Renal bruit 05/16/2013   ? Dyspnea 03/01/2011   ? History of tobacco use 03/01/2011   ? PAC (premature atrial contraction) 04/08/2010   ? Abnormal cardiovascular stress test 11/12/2008   ? CAD (coronary artery disease) 11/12/2008     11/12/08: Cath - Ostial left main stenosis of 20-30%. Proximal LAD stenosis of 30-40%. Diagonal ostial stenosis of 60%. Mid circumflex with 30-40% stenosis. Mid RCA with a 30-40% stenosis. Medical management.    11/23/16: STEMI/Cath - Culprit lesion, subtotal 99% proximal RCA acute thrombotic lesion s/p PCI with a 3.5 x 33 mm Xience Alpine DES. Ostial 20-30% left main lesion, which appears angiographically unchanged compared to the previous films.       ? HTN (hypertension) 11/12/2008   ? HLD (hyperlipidemia) 11/12/2008   ? Hypothyroidism 11/12/2008         Review of Systems   Constitutional: Negative.   HENT: Positive for tinnitus.    Eyes: Negative.    Cardiovascular: Negative.    Respiratory: Negative.    Endocrine: Negative.    Hematologic/Lymphatic: Negative.    Skin: Negative.    Musculoskeletal: Positive for falls.   Gastrointestinal: Positive for dysphagia.   Genitourinary: Negative.    Neurological: Negative.    Psychiatric/Behavioral: Negative.    Allergic/Immunologic: Negative.        Physical Exam  General Appearance: normal in appearance  Skin: warm, moist, no ulcers or xanthomas  Eyes: conjunctivae and lids normal, pupils are equal and round  Lips & Oral Mucosa: no pallor or cyanosis  Neck Veins: neck veins are flat, neck veins are not distended  Chest Inspection: chest is normal in appearance  Respiratory Effort: breathing comfortably, no respiratory distress  Auscultation/Percussion: lungs clear to auscultation, no rales  or rhonchi, no wheezing  Cardiac Rhythm: regular rhythm and normal rate  Cardiac Auscultation: S1, S2 normal, no rub, no gallop  Murmurs: no murmur  Carotid Arteries: normal carotid upstroke bilaterally,?R?bruit  Lower Extremity Edema: no lower extremity edema  Abdominal Exam: soft, non-tender, no masses, bowel sounds normal  Liver &?Spleen: no organomegaly  Language and Memory: patient responsive and seems to comprehend information  Neurologic Exam: neurological assessment grossly intact  ?    Cardiovascular Studies      Cardiovascular Health Factors  Vitals BP Readings from Last 3 Encounters:   05/24/22 (!) 140/70   11/18/21 (!) 144/68   11/01/21 132/74     Wt Readings from Last 3 Encounters:   05/24/22 83.3 kg (183 lb 9.6 oz)   11/18/21 87.5 kg (192 lb 12.8 oz)   11/01/21 89.1 kg (196 lb 6.4 oz)     BMI Readings from Last 3 Encounters:   05/24/22 24.90 kg/m?   11/18/21 26.15 kg/m?   11/01/21 26.64 kg/m?      Smoking Social History     Tobacco Use   Smoking Status Every Day   ? Types: Cigars, Pipe   Smokeless Tobacco Former   ? Types: Chew   ? Quit date: 11/23/2016   Tobacco Comments    smokes pipe and chews tobacco daily      Lipid Profile Cholesterol   Date Value Ref Range Status   10/11/2021 138  Final     HDL   Date Value Ref Range Status   10/11/2021 55  Final     LDL   Date Value Ref Range Status   10/11/2021 66  Final     Triglycerides   Date Value Ref Range Status   10/11/2021 86  Final      Blood Sugar Hemoglobin A1C   Date Value Ref Range Status   11/23/2016 6.2 (H) 4.0 - 6.0 % Final     Comment:     The ADA recommends that most patients with type 1 and type 2 diabetes maintain   an A1c level <7%.       Glucose   Date Value Ref Range Status   10/11/2021 97  Final   06/16/2020 163 (H) 70 - 105 Final   02/19/2019 94  Final          Problems Addressed Today  Encounter Diagnoses   Name Primary?   ? Coronary artery disease involving native coronary artery of native heart without angina pectoris Yes   ? Primary hypertension    ? Pure hypercholesterolemia    ? PAC (premature atrial contraction)    ? ST elevation myocardial infarction involving right coronary artery (HCC)    ? Atrial tachycardia (HCC)    ? Hypotension due to drugs    ? Asymptomatic carotid artery stenosis, right    ? History of tobacco use    ? Stented coronary artery    ? ACE-inhibitor cough    ? Screening for heart disease        Assessment and Plan     Assessment:    1.  Nonobstructive carotid artery disease  ? The most recent carotid artery duplex was performed on 11/01/2021  2.  Coronary artery disease-no symptoms of angina no use of sublingual nitroglycerin  3.  History of ST elevation inferior wall MI, status post PCI to proximal RCA in March 2018  ? The most recent perfusion imaging study performed in March 2023 did not demonstrate ischemia, normal  LVEF = 66%  4.  Hyperlipidemia-on statin therapy  5.  Asymptomatic bradycardia  ? Patient was evaluated with a Holter monitor in June 2021, he was not found to have any evidence of significant bradycardia arrhythmias no high degree AV block  6.  OSA-using the CPAP machine  9.  History of ACE inhibitor induced cough- patient is on an ARB  10.  Mid right lower lobe pleural-based soft tissue density -- this nodule has been followed seriously with a chest CT, the most recent imaging study dated 10/11/2021 demonstrated stability, 1 year follow-up was recommended  11.  Erectile dysfunction-on Cialis    Plan:     1.  Continue current medications  2.  Continue risk factors modification  3.  Follow-up office visit in 6 months    Total Time Today was 40 minutes in the following activities: Preparing to see the patient, Obtaining and/or reviewing separately obtained history, Performing a medically appropriate examination and/or evaluation, Counseling and educating the patient/family/caregiver, Ordering medications, tests, or procedures, Referring and communication with other health care professionals (when not separately reported), Documenting clinical information in the electronic or other health record, Independently interpreting results (not separately reported) and communicating results to the patient/family/caregiver and Care coordination (not separately reported)              Current Medications (including today's revisions)  ? albuterol sulfate (PROAIR HFA) 90 mcg/actuation aerosol inhaler Inhale two puffs by mouth into the lungs every 6 hours as needed for Wheezing or Shortness of Breath. Shake well before use.   ? ascorbic acid (VITAMIN C) 500 mg tablet Take one tablet by mouth daily.   ? Aspirin 81 mg Tab Take one tablet by mouth daily.   ? Calcium Carbonate (CORAL CALCIUM) 1,000 mg tab Take one tablet by mouth daily.   ? cholecalciferol (VITAMIN D3) 50 mcg (2,000 unit) tablet Take one tablet by mouth daily.   ? cyanocobalamin 1,000 mcg tablet Take one tablet by mouth daily.   ? fluticasone propionate (FLONASE) 50 mcg/actuation nasal spray, suspension Apply two sprays to each nostril as directed daily. Shake bottle gently before using.   ? folic acid/multivit-min/lutein (CENTRUM SILVER PO) Take 1 tablet by mouth daily.   ? Ginkgo Biloba 60 mg cap Take two capsules by mouth daily.   ? glucosamine HCl/chondroitin su (GLUCOSAMINE-CHONDROITIN PO) Take 1 tablet by mouth twice daily.   ? krill-om-3-dha-epa-phospho-ast 300-90-24-50 mg cap Take 1 capsule by mouth daily.   ? lansoprazole(+) DR (PREVACID) 15 mg capsule Take one capsule by mouth daily 30 minutes before breakfast.   ? levothyroxine (SYNTHROID) 150 mcg tablet Take one tablet by mouth daily.   ? losartan (COZAAR) 50 mg tablet Take one tablet by mouth at bedtime daily.   ? magnesium oxide (MAGOX) 400 mg (241.3 mg magnesium) tablet Take one tablet by mouth daily.   ? nitroglycerin (NITROSTAT) 0.4 mg tablet Place one tablet under tongue every 5 minutes as needed for Chest Pain. Indications: acute attack of angina   ? Potassium Gluconate 595 mg (99 mg) tab Take one tablet by mouth daily.   ? pyridoxine (vitamin B6) 100 mg tablet Take one tablet by mouth daily.   ? rosuvastatin (CRESTOR) 20 mg tablet Take one tablet by mouth daily. Indications: excessive fat in the blood   ? selenium 200 mcg cap Take one capsule by mouth daily.   ? sertraline (ZOLOFT) 50 mg tablet Take one-half tablet by mouth daily.   ? tadalafil (ADCIRCA) 20  mg tablet Take one tablet by mouth daily. Take one tablet by mouth daily as needed.   ? tiotropium bromide (SPIRIVA RESPIMAT) 2.5 mcg/actuation inhaler Inhale two puffs by mouth into the lungs daily.

## 2022-05-25 ENCOUNTER — Encounter: Admit: 2022-05-25 | Discharge: 2022-05-25 | Payer: MEDICARE

## 2022-12-08 ENCOUNTER — Encounter: Admit: 2022-12-08 | Discharge: 2022-12-08 | Payer: MEDICARE

## 2022-12-08 DIAGNOSIS — I491 Atrial premature depolarization: Secondary | ICD-10-CM

## 2022-12-08 DIAGNOSIS — Z23 Encounter for immunization: Secondary | ICD-10-CM

## 2022-12-08 DIAGNOSIS — E7800 Pure hypercholesterolemia, unspecified: Secondary | ICD-10-CM

## 2022-12-08 DIAGNOSIS — G4733 Obstructive sleep apnea (adult) (pediatric): Secondary | ICD-10-CM

## 2022-12-08 DIAGNOSIS — I952 Hypotension due to drugs: Secondary | ICD-10-CM

## 2022-12-08 DIAGNOSIS — R634 Abnormal weight loss: Secondary | ICD-10-CM

## 2022-12-08 DIAGNOSIS — J189 Pneumonia, unspecified organism: Secondary | ICD-10-CM

## 2022-12-08 DIAGNOSIS — R9431 Abnormal electrocardiogram [ECG] [EKG]: Secondary | ICD-10-CM

## 2022-12-08 DIAGNOSIS — R9439 Abnormal result of other cardiovascular function study: Secondary | ICD-10-CM

## 2022-12-08 DIAGNOSIS — J302 Other seasonal allergic rhinitis: Secondary | ICD-10-CM

## 2022-12-08 DIAGNOSIS — Z720 Tobacco use: Secondary | ICD-10-CM

## 2022-12-08 DIAGNOSIS — I4719 Atrial tachycardia (HCC): Secondary | ICD-10-CM

## 2022-12-08 DIAGNOSIS — R0989 Other specified symptoms and signs involving the circulatory and respiratory systems: Secondary | ICD-10-CM

## 2022-12-08 DIAGNOSIS — I2510 Atherosclerotic heart disease of native coronary artery without angina pectoris: Secondary | ICD-10-CM

## 2022-12-08 DIAGNOSIS — K219 Gastro-esophageal reflux disease without esophagitis: Secondary | ICD-10-CM

## 2022-12-08 DIAGNOSIS — I6521 Occlusion and stenosis of right carotid artery: Secondary | ICD-10-CM

## 2022-12-08 DIAGNOSIS — I219 Acute myocardial infarction, unspecified: Secondary | ICD-10-CM

## 2022-12-08 DIAGNOSIS — I10 Essential (primary) hypertension: Secondary | ICD-10-CM

## 2022-12-08 DIAGNOSIS — I2111 ST elevation (STEMI) myocardial infarction involving right coronary artery: Secondary | ICD-10-CM

## 2022-12-08 DIAGNOSIS — Z955 Presence of coronary angioplasty implant and graft: Secondary | ICD-10-CM

## 2022-12-08 DIAGNOSIS — R058 ACE-inhibitor cough: Secondary | ICD-10-CM

## 2022-12-08 DIAGNOSIS — J449 Chronic obstructive pulmonary disease, unspecified: Secondary | ICD-10-CM

## 2022-12-08 DIAGNOSIS — I00 Rheumatic fever without heart involvement: Secondary | ICD-10-CM

## 2022-12-08 DIAGNOSIS — E785 Hyperlipidemia, unspecified: Secondary | ICD-10-CM

## 2022-12-08 DIAGNOSIS — E039 Hypothyroidism, unspecified: Secondary | ICD-10-CM

## 2022-12-08 MED ORDER — LOSARTAN 50 MG PO TAB
50 mg | ORAL_TABLET | Freq: Two times a day (BID) | ORAL | 3 refills | 90.00000 days | Status: AC
Start: 2022-12-08 — End: ?

## 2022-12-08 NOTE — Patient Instructions
Thank you for visiting our office today.    We would like to make the following medication adjustments:    Increase Losartan to 50mg  twice daily       Otherwise continue the same medications as you have been doing.          We will be pursuing the following tests after your appointment today:       Orders Placed This Encounter    losartan (COZAAR) 50 mg tablet         Please call us in the meantime with any questions or concerns.        Please allow 5-7 business days for our providers to review your results. All normal results will go to MyChart. If you do not have Mychart, it is strongly recommended to get this so you can easily view all your results. If you do not have mychart, we will attempt to call you once with normal lab and testing results. If we cannot reach you by phone with normal results, we will send you a letter.  If you have not heard the results of your testing after one week please give [place] a call.       Your Cardiovascular [facility] [place] Team Brett Canales, Pilar Jarvis, Shawna Orleans, and [place])  phone number is [phone].

## 2022-12-08 NOTE — Progress Notes
Date of Service: 12/08/2022    Jack Wagner is a 82 y.o. male.       HPI        Jack Wagner is a 82 y.o. 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      In March 2023 patient was evaluated with the following:     1.  Myocardial perfusion imaging study-the perfusion pattern was normal, did not show any ischemia, normal LVEF = 66%.  2.  Carotid artery duplex-no hemodynamically significant stenosis measured in the bilateral common and left internal carotid arteries, there are elevated velocities and turbulent flow in the right mid to distal segment of the internal carotid artery, very likely secondary to vessel tortuosity, normal antegrade flow in the bilateral vertebral arteries, no evidence of proximal subclavian.    Today in the office patient's blood pressure was elevated, we did record 162/80 mmHg.  He does report missing few doses of losartan last week.    He does not report having chest pain and no heart palpitations, he did not take any sublingual nitroglycerin.         Vitals:    12/08/22 0956   BP: (!) 162/80   BP Source: Arm, Left Upper   Pulse: 60   O2 Device: None (Room air)   PainSc: Zero   Weight: 80.3 kg (177 lb)   Height: 182.9 cm (6')     Body mass index is 24.01 kg/m?Marland Kitchen     Past Medical History  Patient Active Problem List    Diagnosis Date Noted    COVID-19 vaccine administered 02/25/2020    Bradycardia 02/25/2020    PAC (premature atrial contraction) 02/25/2020    OSA (obstructive sleep apnea) 05/09/2019     Home sleep study done on 72,020 showed moderate sleep apnea with AHI of 22.7.  Pt set up with CPAP @ 5-15cm H2O on 05/09/19.  DME: [facility]  Insurance: Medicare  If the patient has to meet compliance, their compliance window will be from 06/09/19 to 08/07/19. met      Abnormal Holter exam 04/30/2019    Fall 12/25/2018    Asymptomatic carotid artery stenosis, right 12/25/2018    Stented coronary artery 05/24/2018    Cough 10/03/2017    ACE-inhibitor cough 10/03/2017    Snoring 05/29/2017     - Witnessed apneas and snoring  - ESS 10      COPD (chronic obstructive pulmonary disease) (HCC) 05/29/2017     GOLD Staging: A1  mMRC: 1  GOLD classification of airflow obstruction: 1  Number exacerbations in past year: 0  PFT (05/29/2017)  FVC 4.49L (101% pred)  FEV1 2.65L (82% pred)  FEV1/FVC 59%  RV 2.98L (114% pred)  DLCO 94% pred  Inhaler Regimen  Spiriva 2 puffs once a day  Vaccinations  Influenza - Refused  Pneumococcal - Will need to check with PCP to ensure this has occured  Prevnar - Will need to check with PCP to ensure this has occured  Pulm Rehab   Currently performing cardiac rehab  Smoking  40 pk year, quit 10/2016  LDCT screening  Recommended to patient, he would like to consider and discuss further at upcoming appointment  Dysgeusia 04/12/2017     Added automatically from request for surgery [id number]      Weight loss 03/30/2017    Hypotension due to drugs 03/30/2017    BRBPR (bright red blood per rectum) 02/23/2017     Added automatically from request for surgery [id number]      Colon cancer screening 02/23/2017     Added automatically from request for surgery [id number]      History of anemia 02/23/2017     Added automatically from request for surgery [id number]      Tobacco abuse 02/23/2017     - At least 50 pk years, quit 10/2016      Dysphagia 02/23/2017     Added automatically from request for surgery [id number]      Gastroesophageal reflux disease with esophagitis 02/22/2017    Cough 02/22/2017     - Chronic cough for years   - Some choking with eating   - All times per day   - Light yellow sputum production   - Possible post-nasal drip  - Exposure history:   - Previous chicken exposure   - Current down pillows (has slept on one his entire life)   - 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   - 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  - PFTs 05/29/2017: mild obstruction, normal lung volumes and diffusion capacity  - CXR 05/29/2017: no acute abnormality      Bad breath 02/22/2017    Bright red rectal bleeding 02/22/2017    Other dysphagia 02/22/2017    Prediabetes 11/25/2016    Atrial tachycardia (HCC) 11/24/2016    ST elevation myocardial infarction involving right coronary artery (HCC) 11/23/2016    Bradycardia 05/16/2013    Renal bruit 05/16/2013    Dyspnea 03/01/2011    History of tobacco use 03/01/2011    PAC (premature atrial contraction) 04/08/2010    Abnormal cardiovascular stress test 11/12/2008    CAD (coronary artery disease) 11/12/2008     11/12/08: Cath - Ostial left main stenosis of 20-30%. Proximal LAD stenosis of 30-40%. Diagonal ostial stenosis of 60%. Mid circumflex with 30-40% stenosis. Mid RCA with a 30-40% stenosis. Medical management.    11/23/16: STEMI/Cath - Culprit lesion, subtotal 99% proximal RCA acute thrombotic lesion s/p PCI with a 3.5 x 33 mm Xience Alpine DES. Ostial 20-30% left main lesion, which appears angiographically unchanged compared to the previous films.        HTN (hypertension) 11/12/2008    HLD (hyperlipidemia) 11/12/2008    Hypothyroidism 11/12/2008         Review of Systems   Constitutional: Negative.   HENT: Negative.     Eyes: Negative.    Cardiovascular: Negative.    Respiratory: Negative.     Endocrine: Negative.    Hematologic/Lymphatic: Negative.    Skin: Negative.    Musculoskeletal: Negative.    Gastrointestinal: Negative.    Genitourinary: Negative.    Neurological: Negative.    Psychiatric/Behavioral: Negative.     Allergic/Immunologic: Negative.        Physical Exam  General Appearance: normal in appearance  Skin: warm, moist, no ulcers or xanthomas  Eyes: conjunctivae and lids normal, pupils are equal and round  Lips & Oral Mucosa: no pallor or cyanosis  Neck Veins: neck veins are flat, neck veins are not distended  Chest Inspection: chest is normal in appearance  Respiratory Effort: breathing comfortably, no respiratory distress  Auscultation/Percussion: lungs clear to auscultation, no rales or rhonchi, no wheezing  Cardiac Rhythm:  regular rhythm and normal rate  Cardiac Auscultation: S1, S2 normal, no rub, no gallop  Murmurs: no murmur  Carotid Arteries: normal carotid upstroke bilaterally,  R bruit   Lower Extremity Edema: no lower extremity edema  Abdominal Exam: soft, non-tender, no masses, bowel sounds normal  Liver & Spleen: no organomegaly  Language and Memory: patient responsive and seems to comprehend information  Neurologic Exam: neurological assessment grossly intact      Cardiovascular Studies      Cardiovascular Health Factors  Vitals BP Readings from Last 3 Encounters:   12/08/22 (!) 162/80   05/24/22 (!) 140/70   11/18/21 (!) 144/68     Wt Readings from Last 3 Encounters:   12/08/22 80.3 kg (177 lb)   05/24/22 83.3 kg (183 lb 9.6 oz)   11/18/21 87.5 kg (192 lb 12.8 oz)     BMI Readings from Last 3 Encounters:   12/08/22 24.01 kg/m?   05/24/22 24.90 kg/m?   11/18/21 26.15 kg/m?      Smoking Social History     Tobacco Use   Smoking Status Every Day    Types: Cigars, Pipe   Smokeless Tobacco Former    Types: Chew    Quit date: 11/23/2016   Tobacco Comments    smokes pipe and chews tobacco daily      Lipid Profile Cholesterol   Date Value Ref Range Status   10/11/2021 138  Final     HDL   Date Value Ref Range Status   10/11/2021 55  Final     LDL   Date Value Ref Range Status   10/11/2021 66  Final     Triglycerides   Date Value Ref Range Status   10/11/2021 86  Final      Blood Sugar Hemoglobin A1C   Date Value Ref Range Status   11/23/2016 6.2 (H) 4.0 - 6.0 % Final     Comment:     The ADA recommends that most patients with type 1 and type 2 diabetes maintain   an A1c level <7%.       Glucose   Date Value Ref Range Status   10/11/2021 97  Final   06/16/2020 163 (H) 70 - 105 Final   02/19/2019 94  Final          Problems Addressed Today  Encounter Diagnoses   Name Primary?    ST elevation myocardial infarction involving right coronary artery (HCC) Yes    PAC (premature atrial contraction)     Hypotension due to drugs     Primary hypertension     Pure hypercholesterolemia     Coronary artery disease involving native coronary artery of native heart without angina pectoris     Atrial tachycardia (HCC)     Asymptomatic carotid artery stenosis, right     Weight loss     Tobacco abuse     Stented coronary artery     Renal bruit     OSA (obstructive sleep apnea)     COVID-19 vaccine administered     Chronic obstructive pulmonary disease, unspecified COPD type (HCC)     ACE-inhibitor cough     Abnormal Holter exam     Abnormal cardiovascular stress test        Assessment and Plan     Assessment:     1.  Nonobstructive carotid artery disease  The most recent carotid artery duplex was performed on 11/01/2021  2.  Coronary artery disease-no symptoms of angina  no use of sublingual nitroglycerin  3.  History of ST elevation inferior wall MI, status post PCI to proximal RCA in March 2018  The most recent perfusion imaging study performed in March 2023 did not demonstrate ischemia, normal LVEF = 66%  4.  Hyperlipidemia-on statin therapy  5.  Asymptomatic bradycardia  Patient was evaluated with a Holter monitor in June 2021, he was not found to have any evidence of significant bradycardia arrhythmias no high degree AV block  6.  OSA-using the CPAP machine  9.  History of ACE inhibitor induced cough- patient is on an ARB  10.  Mid right lower lobe pleural-based soft tissue density -- this nodule has been followed seriously with a chest CT, the most recent imaging study dated 10/11/2021 demonstrated stability, 1 year follow-up was recommended  11.  Erectile dysfunction-on Cialis  12.  Primary hypertension-suboptimally controlled    Plan:    1.  Increase losartan to 50 mg p.o. twice daily-I did modify this prescription  2.  I did recommend to follow a strict low-sodium diet  3.  Follow-up office visit in 6 months.     Total Time Today was 30 minutes in the following activities: Preparing to see the patient, Obtaining and/or reviewing separately obtained history, Performing a medically appropriate examination and/or evaluation, Counseling and educating the patient/family/caregiver, Ordering medications, tests, or procedures, Referring and communication with other health care professionals (when not separately reported), Documenting clinical information in the electronic or other health record, Independently interpreting results (not separately reported) and communicating results to the patient/family/caregiver, and Care coordination (not separately reported)          Current Medications (including today's revisions)   albuterol sulfate (PROAIR HFA) 90 mcg/actuation aerosol inhaler Inhale two puffs by mouth into the lungs every 6 hours as needed for Wheezing or Shortness of Breath. Shake well before use.    ascorbic acid (VITAMIN C) 500 mg tablet Take one tablet by mouth daily.    Aspirin 81 mg Tab Take one tablet by mouth daily.    Calcium Carbonate (CORAL CALCIUM) 1,000 mg tab Take one tablet by mouth daily.    cholecalciferol (VITAMIN D3) 50 mcg (2,000 unit) tablet Take one tablet by mouth daily.    cyanocobalamin 1,000 mcg tablet Take one tablet by mouth daily.    fluticasone propionate (FLONASE) 50 mcg/actuation nasal spray, suspension Apply two sprays to each nostril as directed daily. Shake bottle gently before using.    folic acid/multivit-min/lutein (CENTRUM SILVER PO) Take 1 tablet by mouth daily.    Ginkgo Biloba 60 mg cap Take two capsules by mouth daily.    glucosamine HCl/chondroitin su (GLUCOSAMINE-CHONDROITIN PO) Take 1 tablet by mouth twice daily.    krill-om-3-dha-epa-phospho-ast 300-90-24-50 mg cap Take 1 capsule by mouth daily.    lansoprazole(+) DR (PREVACID) 15 mg capsule Take one capsule by mouth daily 30 minutes before breakfast.    levothyroxine (SYNTHROID) 150 mcg tablet Take one tablet by mouth daily.    losartan (COZAAR) 50 mg tablet Take one tablet by mouth at bedtime daily. Indications: high blood pressure    magnesium oxide (MAGOX) 400 mg (241.3 mg magnesium) tablet Take one tablet by mouth daily.    nitroglycerin (NITROSTAT) 0.4 mg tablet Place one tablet under tongue every 5 minutes as needed for Chest Pain. Indications: acute attack of angina    Potassium Gluconate 595 mg (99 mg) tab Take one tablet by mouth daily.    pyridoxine (vitamin B6) 100 mg tablet  Take one tablet by mouth daily.    rosuvastatin (CRESTOR) 20 mg tablet Take one tablet by mouth daily. Indications: excessive fat in the blood    selenium 200 mcg cap Take one capsule by mouth daily.    sertraline (ZOLOFT) 50 mg tablet Take one-half tablet by mouth daily.    tadalafiL (CIALIS) 20 mg tablet Take one tablet by mouth as Needed for Erectile dysfunction.    tiotropium bromide (SPIRIVA RESPIMAT) 2.5 mcg/actuation inhaler Inhale two puffs by mouth into the lungs daily.

## 2022-12-29 ENCOUNTER — Encounter: Admit: 2022-12-29 | Discharge: 2022-12-29 | Payer: MEDICARE

## 2023-02-19 IMAGING — CT LOW_DOSE_LUNG(Adult)
2 of 9 series · 11 of 36 positions shown, 14 images · non-contrast
Comparison: none

[Series 10: lung cor 1.00 br44 s3 · coronal · 0.67mm/px · 1 of 295 slices shown]
[im 148/295  lung]
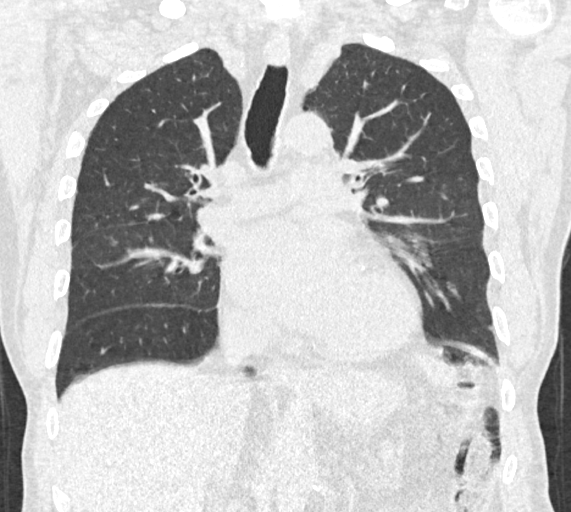

[Series 17: lung 1.00 br60 s3 cad · axial · 0.74mm/px · z∈[+1515,+1793]mm · 10 of 484 slices shown, 13 images]
[im 44/484  mediastinal]
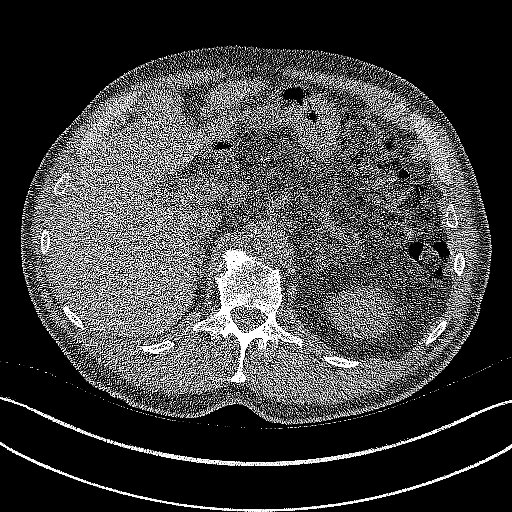
[im 44/484  lung]
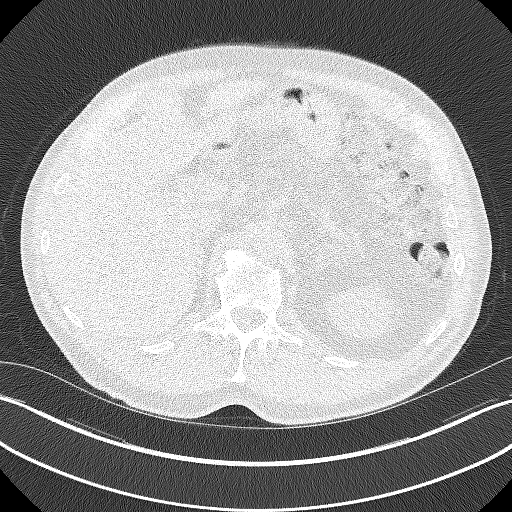
[im 88/484  lung]
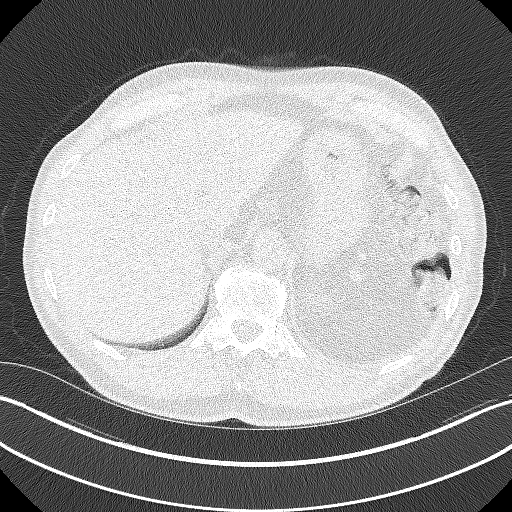
[im 132/484  lung]
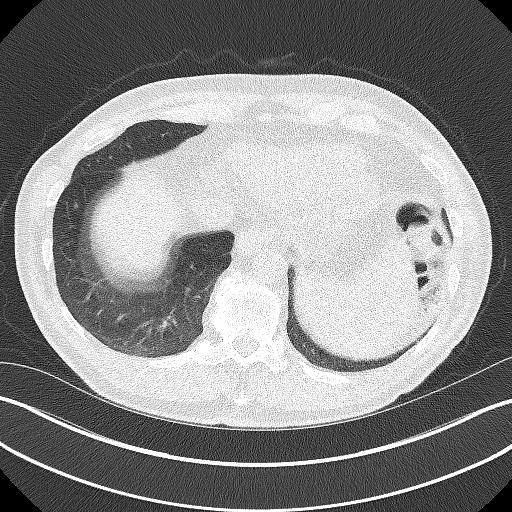
[im 176/484  lung]
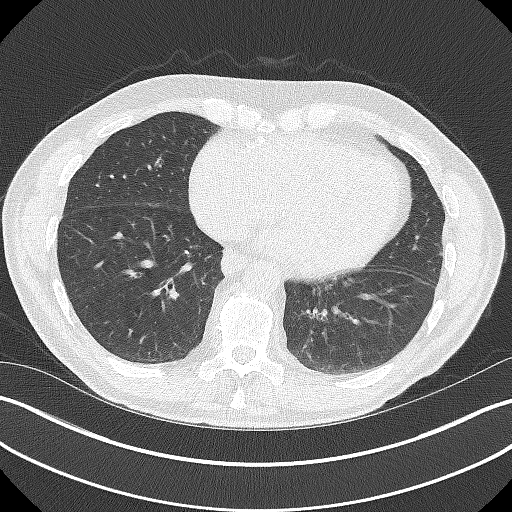
[im 220/484  mediastinal]
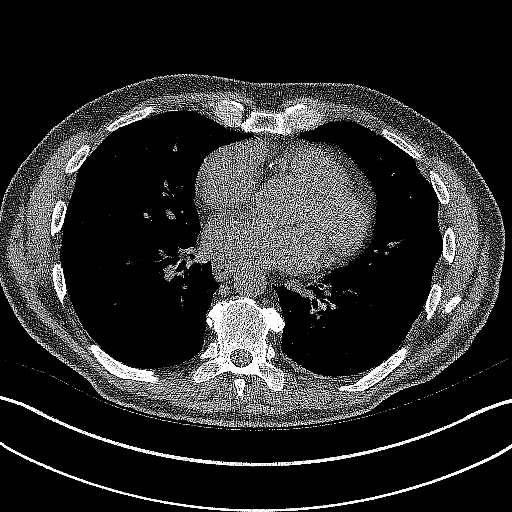
[im 220/484  lung]
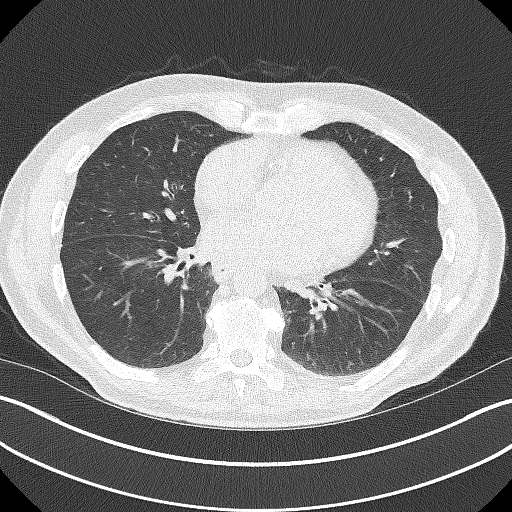
[im 264/484  lung]
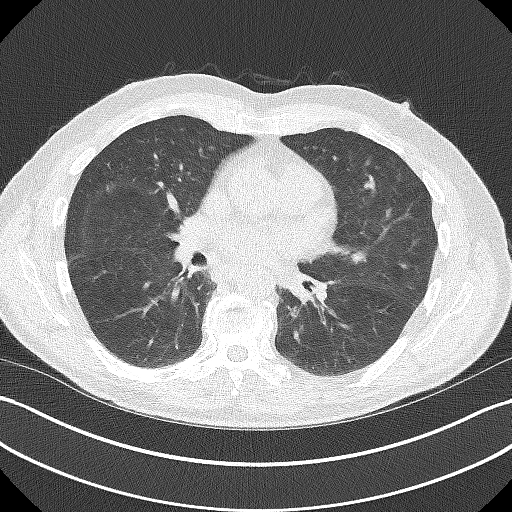
[im 308/484  lung]
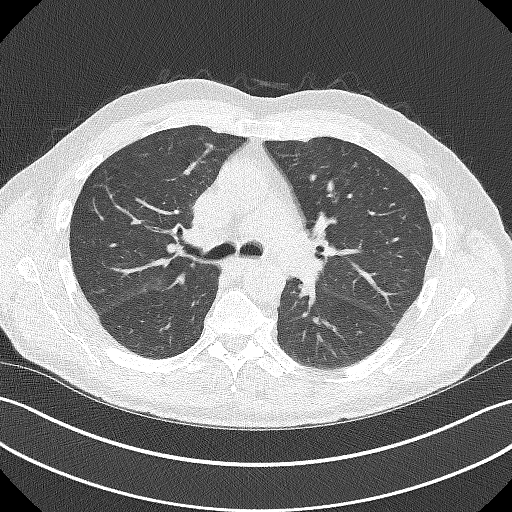
[im 352/484  lung]
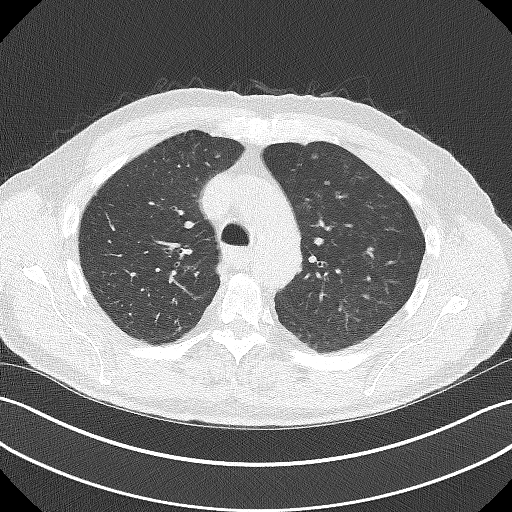
[im 396/484  mediastinal]
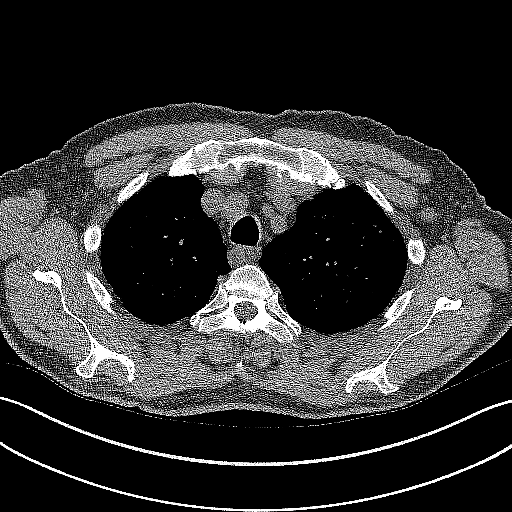
[im 396/484  lung]
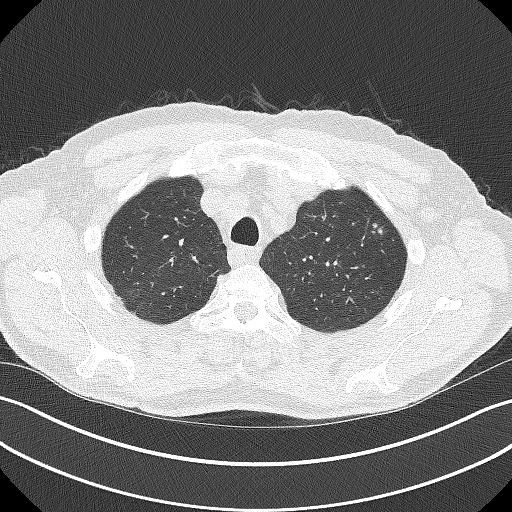
[im 440/484  lung]
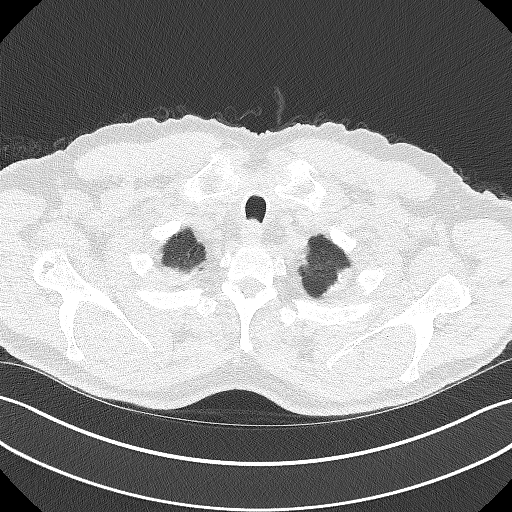

[11 of 36 positions shown; findings below may reference images not displayed]

DIAGNOSTIC STUDIES

EXAM

T lung screening

INDICATION

nicotine dependence
PT STATES SMOKER 1 PACK PER DAY X 60 YEARS. CURRENTLY SMOKING CIGARS. CT/NM 0/0. TJ

TECHNIQUE

All CT scans at this facility use dose modulation, iterative reconstruction, and/or weight based
dosing when appropriate to reduce radiation dose to as low as reasonably achievable.

Number of previous computed tomography exams in the last 12 months is 0 .

Number of previous nuclear medicine myocardial perfusion studies in the last 12 months is 0 .

COMPARISONS

June 16, 2020

FINDINGS

Low-dose technique is limited in evaluation of mediastinal and subdiaphragmatic structures. Mild
cardiomegaly is noted. There is calcification of the coronary vessels. No pericardial effusion is
seen.

Bronchial wall thickening is evident bilaterally consistent with acute or chronic bronchitis.
Medial right lower lobe pleural based soft tissue density measures 9 millimeters image 225 series 2.
This is unchanged from prior exam. No new pulmonary nodules are seen.

No concerning osseous lesions.

IMPRESSION

Stable CT scan of the chest. One year follow-up is recommended. Lung rads 2.

Tech Notes:

PT STATES SMOKER 1 PACK PER DAY X 60 YEARS. CURRENTLY SMOKING CIGARS. CT/NM 0/0. TJ

## 2023-05-07 ENCOUNTER — Encounter: Admit: 2023-05-07 | Discharge: 2023-05-07 | Payer: MEDICARE

## 2023-05-07 MED ORDER — ROSUVASTATIN 20 MG PO TAB
ORAL_TABLET | 3 refills
Start: 2023-05-07 — End: ?

## 2023-10-04 ENCOUNTER — Encounter: Admit: 2023-10-04 | Discharge: 2023-10-04 | Payer: MEDICARE

## 2023-10-05 ENCOUNTER — Encounter: Admit: 2023-10-05 | Discharge: 2023-10-05 | Payer: MEDICARE

## 2023-10-05 ENCOUNTER — Ambulatory Visit: Admit: 2023-10-05 | Discharge: 2023-10-05 | Payer: MEDICARE

## 2023-10-05 DIAGNOSIS — I4719 Atrial tachycardia (HCC): Secondary | ICD-10-CM

## 2023-10-05 DIAGNOSIS — I2111 ST elevation (STEMI) myocardial infarction involving right coronary artery: Secondary | ICD-10-CM

## 2023-10-05 DIAGNOSIS — I6521 Occlusion and stenosis of right carotid artery: Secondary | ICD-10-CM

## 2023-10-05 DIAGNOSIS — I10 Essential (primary) hypertension: Secondary | ICD-10-CM

## 2023-10-05 DIAGNOSIS — Z720 Tobacco use: Secondary | ICD-10-CM

## 2023-10-05 DIAGNOSIS — I952 Hypotension due to drugs: Secondary | ICD-10-CM

## 2023-10-05 DIAGNOSIS — R9431 Abnormal electrocardiogram [ECG] [EKG]: Secondary | ICD-10-CM

## 2023-10-05 DIAGNOSIS — Z136 Encounter for screening for cardiovascular disorders: Secondary | ICD-10-CM

## 2023-10-05 DIAGNOSIS — I491 Atrial premature depolarization: Secondary | ICD-10-CM

## 2023-10-05 DIAGNOSIS — I2510 Atherosclerotic heart disease of native coronary artery without angina pectoris: Secondary | ICD-10-CM

## 2023-10-05 DIAGNOSIS — G4733 Obstructive sleep apnea (adult) (pediatric): Secondary | ICD-10-CM

## 2023-10-05 DIAGNOSIS — E7800 Pure hypercholesterolemia, unspecified: Secondary | ICD-10-CM

## 2023-10-05 DIAGNOSIS — R058 ACE-inhibitor cough: Secondary | ICD-10-CM

## 2023-10-05 MED ORDER — ALBUTEROL SULFATE 90 MCG/ACTUATION IN HFAA
2 | RESPIRATORY_TRACT | 3 refills | Status: AC | PRN
Start: 2023-10-05 — End: ?

## 2023-10-05 NOTE — Progress Notes
Date of Service: 10/05/2023    Jack Wagner is a 83 y.o. male.       HPI     Jack Wagner is a 83 y.o. 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    Patient has not experienced chest pain, no heart palpitations, no presyncope or syncope.  He does report having cough.  His wife present today at his office visit he reports that he smokes 1 or 2 cigars every day.  The physical exam did reveal diffuse posterior rhonchi, no wheezing.     In March 2023 patient was evaluated with the following:     1.  Myocardial perfusion imaging study-the perfusion pattern was normal, did not show any ischemia, normal LVEF = 66%.  2.  Carotid artery duplex-no hemodynamically significant stenosis measured in the bilateral common and left internal carotid arteries, there are elevated velocities and turbulent flow in the right mid to distal segment of the internal carotid artery, very likely secondary to vessel tortuosity, normal antegrade flow in the bilateral vertebral arteries, no evidence of proximal subclavian.    Vitals:    10/05/23 1317   BP: 138/75   BP Source: Arm, Left Upper   Pulse: 56   O2 Device: None (Room air)   PainSc: Zero   Weight: 85.4 kg (188 lb 3.2 oz)   Height: 182.9 cm (6')     Body mass index is 25.52 kg/m?Marland Kitchen     Past Medical History  Patient Active Problem List    Diagnosis Date Noted    COVID-19 vaccine administered 02/25/2020    Bradycardia 02/25/2020    PAC (premature atrial contraction) 02/25/2020    OSA (obstructive sleep apnea) 05/09/2019     Home sleep study done on 72,020 showed moderate sleep apnea with AHI of 22.7.  Pt set up with CPAP @ 5-15cm H2O on 05/09/19.  DME: [facility]  Insurance: Medicare  If the patient has to meet compliance, their compliance window will be from 06/09/19 to 08/07/19. met      Abnormal Holter exam 04/30/2019    Fall 12/25/2018    Asymptomatic carotid artery stenosis, right 12/25/2018    Stented coronary artery 05/24/2018    Cough 10/03/2017    ACE-inhibitor cough 10/03/2017    Snoring 05/29/2017     - Witnessed apneas and snoring  - ESS 10      COPD (chronic obstructive pulmonary disease) (HCC) 05/29/2017     GOLD Staging: A1  mMRC: 1  GOLD classification of airflow obstruction: 1  Number exacerbations in past year: 0  PFT (05/29/2017)  FVC 4.49L (101% pred)  FEV1 2.65L (82% pred)  FEV1/FVC 59%  RV 2.98L (114% pred)  DLCO 94% pred  Inhaler Regimen  Spiriva 2 puffs once a day  Vaccinations  Influenza - Refused  Pneumococcal - Will need to check with PCP to ensure this has occured  Prevnar - Will need to check with PCP to ensure this has occured  Pulm Rehab   Currently performing cardiac rehab  Smoking  40 pk year, quit 10/2016  LDCT screening  Recommended to patient, he would like to consider and discuss further at upcoming appointment  Dysgeusia 04/12/2017     Added automatically from request for surgery [id number]      Weight loss 03/30/2017    Hypotension due to drugs 03/30/2017    BRBPR (bright red blood per rectum) 02/23/2017     Added automatically from request for surgery [id number]      Colon cancer screening 02/23/2017     Added automatically from request for surgery [id number]      History of anemia 02/23/2017     Added automatically from request for surgery [id number]      Tobacco abuse 02/23/2017     - At least 50 pk years, quit 10/2016      Dysphagia 02/23/2017     Added automatically from request for surgery [id number]      Gastroesophageal reflux disease with esophagitis 02/22/2017    Cough 02/22/2017     - Chronic cough for years   - Some choking with eating   - All times per day   - Light yellow sputum production   - Possible post-nasal drip  - Exposure history:   - Previous chicken exposure   - Current down pillows (has slept on one his entire life)   - 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   - 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  - PFTs 05/29/2017: mild obstruction, normal lung volumes and diffusion capacity  - CXR 05/29/2017: no acute abnormality      Bad breath 02/22/2017    Bright red rectal bleeding 02/22/2017    Other dysphagia 02/22/2017    Prediabetes 11/25/2016    Atrial tachycardia (HCC) 11/24/2016    ST elevation myocardial infarction involving right coronary artery (HCC) 11/23/2016    Bradycardia 05/16/2013    Renal bruit 05/16/2013    Dyspnea 03/01/2011    History of tobacco use 03/01/2011    PAC (premature atrial contraction) 04/08/2010    Abnormal cardiovascular stress test 11/12/2008    CAD (coronary artery disease) 11/12/2008     11/12/08: Cath - Ostial left main stenosis of 20-30%. Proximal LAD stenosis of 30-40%. Diagonal ostial stenosis of 60%. Mid circumflex with 30-40% stenosis. Mid RCA with a 30-40% stenosis. Medical management.    11/23/16: STEMI/Cath - Culprit lesion, subtotal 99% proximal RCA acute thrombotic lesion s/p PCI with a 3.5 x 33 mm Xience Alpine DES. Ostial 20-30% left main lesion, which appears angiographically unchanged compared to the previous films.        HTN (hypertension) 11/12/2008    HLD (hyperlipidemia) 11/12/2008    Hypothyroidism 11/12/2008         Review of Systems   Constitutional: Negative.   HENT:  Positive for stridor.    Eyes: Negative.    Cardiovascular:  Positive for dyspnea on exertion.   Respiratory:  Positive for cough, shortness of breath, sputum production and wheezing.    Endocrine: Negative.    Hematologic/Lymphatic: Negative.    Skin: Negative.    Musculoskeletal:  Positive for back pain.   Gastrointestinal: Negative.    Genitourinary: Negative.    Neurological: Negative.    Psychiatric/Behavioral: Negative.     Allergic/Immunologic: Negative.        Physical Exam  General Appearance: normal in appearance  Skin: warm, moist, no ulcers or xanthomas  Eyes: conjunctivae and lids normal, pupils are equal and round  Lips & Oral Mucosa: no pallor or cyanosis  Neck Veins: neck veins are flat, neck veins are not distended  Chest Inspection: chest is normal in appearance  Respiratory Effort: breathing comfortably,  no respiratory distress  Auscultation/Percussion: lungs clear to auscultation, no rales, + rhonchi, no wheezing  Cardiac Rhythm: regular rhythm and normal rate  Cardiac Auscultation: S1, S2 normal, no rub, no gallop  Murmurs: no murmur  Carotid Arteries: normal carotid upstroke bilaterally, no bruit  Lower Extremity Edema: no lower extremity edema  Abdominal Exam: soft, non-tender, no masses, bowel sounds normal  Liver & Spleen: no organomegaly  Language and Memory: patient responsive and seems to comprehend information  Neurologic Exam: neurological assessment grossly intact      Cardiovascular Studies  Twelve-lead EKG demonstrates normal sinus rhythm, sinus bradycardia with short PR interval, ventricular rate 52 bpm, no axis deviation.    Cardiovascular Health Factors  Vitals BP Readings from Last 3 Encounters:   10/05/23 138/75   12/08/22 (!) 162/80   05/24/22 (!) 140/70     Wt Readings from Last 3 Encounters:   10/05/23 85.4 kg (188 lb 3.2 oz)   12/08/22 80.3 kg (177 lb)   05/24/22 83.3 kg (183 lb 9.6 oz)     BMI Readings from Last 3 Encounters:   10/05/23 25.52 kg/m?   12/08/22 24.01 kg/m?   05/24/22 24.90 kg/m?      Smoking Social History     Tobacco Use   Smoking Status Every Day    Types: Cigars, Pipe   Smokeless Tobacco Former    Types: Chew    Quit date: 11/23/2016   Tobacco Comments    smokes pipe and chews tobacco daily      Lipid Profile Cholesterol   Date Value Ref Range Status   10/11/2021 138  Final     HDL   Date Value Ref Range Status   10/11/2021 55  Final     LDL   Date Value Ref Range Status   10/11/2021 66  Final     Triglycerides   Date Value Ref Range Status   10/11/2021 86  Final      Blood Sugar Hemoglobin A1C   Date Value Ref Range Status   11/23/2016 6.2 (H) 4.0 - 6.0 % Final     Comment:     The ADA recommends that most patients with type 1 and type 2 diabetes maintain   an A1c level <7%.       Glucose   Date Value Ref Range Status   03/17/2023 122 (H) 70 - 105 Final   10/11/2021 97  Final   06/16/2020 163 (H) 70 - 105 Final          Problems Addressed Today  Encounter Diagnoses   Name Primary?    ST elevation myocardial infarction involving right coronary artery (HCC) Yes    PAC (premature atrial contraction)     Hypotension due to drugs     Primary hypertension     Pure hypercholesterolemia     Coronary artery disease involving native coronary artery of native heart without angina pectoris     Atrial tachycardia (HCC)     Asymptomatic carotid artery stenosis, right     Tobacco abuse     OSA (obstructive sleep apnea)     ACE-inhibitor cough     Abnormal Holter exam     Screening for heart disease        Assessment and Plan     Assessment:     1.  Nonobstructive carotid artery disease  The most recent carotid artery duplex was performed on 11/01/2021  2.  Coronary artery disease-no symptoms of angina no use  of sublingual nitroglycerin  3.  History of ST elevation IWMI, status post PCI to proximal RCA in March 2018  The most recent perfusion imaging study performed in March 2023 did not demonstrate ischemia, normal LVEF = 66%  4.  Hyperlipidemia-on statin therapy  5.  Asymptomatic bradycardia  Patient was evaluated with a Holter monitor in June 2021, he was not found to have any evidence of significant bradycardia arrhythmias no high degree AV block  6.  OSA-using the CPAP machine  9.  History of ACE inhibitor induced cough- patient is on an ARB  10.  Mid right lower lobe pleural-based soft tissue density -- this nodule has been followed seriously with a chest CT, the most recent imaging study dated 10/11/2021 demonstrated stability, 1 year follow-up was recommended  11.  Erectile dysfunction-on Cialis  12.  Primary hypertension-suboptimally controlled  13.  Abnormal findings on the physical exam-posterior rhonchi, patient also reports wheezing  He does smoke 1-2 cigarettes a day    Plan:    1.  Continue all current medications  2.  Continue risk factors modifications  3.  Follow-up office visit in 6 months    Total Time Today was 30 minutes in the following activities: Preparing to see the patient, Obtaining and/or reviewing separately obtained history, Performing a medically appropriate examination and/or evaluation, Counseling and educating the patient/family/caregiver, Ordering medications, tests, or procedures, Referring and communication with other health care professionals (when not separately reported), Documenting clinical information in the electronic or other health record, Independently interpreting results (not separately reported) and communicating results to the patient/family/caregiver, and Care coordination (not separately reported)          Current Medications (including today's revisions)   albuterol sulfate (PROAIR HFA) 90 mcg/actuation aerosol inhaler Inhale two puffs by mouth into the lungs every 6 hours as needed for Wheezing or Shortness of Breath. Shake well before use. (Patient not taking: Reported on 10/05/2023)    ascorbic acid (vitamin C) (VITAMIN C) 1,000 mg tablet Take one tablet by mouth daily.    Aspirin 81 mg Tab Take one tablet by mouth daily.    Calcium Carbonate (CORAL CALCIUM) 1,000 mg tab Take one tablet by mouth daily.    cholecalciferol (VITAMIN D3) 50 mcg (2,000 unit) tablet Take one tablet by mouth daily.    cyanocobalamin 1,000 mcg tablet Take one tablet by mouth daily.    folic acid/multivit-min/lutein (CENTRUM SILVER PO) Take 1 tablet by mouth daily.    glucosamine HCl/chondroitin su (GLUCOSAMINE-CHONDROITIN PO) Take 1 tablet by mouth twice daily.    krill-om-3-dha-epa-phospho-ast 300-90-24-50 mg cap Take 1 capsule by mouth daily.    lansoprazole(+) DR (PREVACID) 15 mg capsule Take one capsule by mouth daily 30 minutes before breakfast.    levothyroxine (SYNTHROID) 150 mcg tablet Take one tablet by mouth daily.    losartan (COZAAR) 50 mg tablet Take one tablet by mouth twice daily. Indications: high blood pressure    nitroglycerin (NITROSTAT) 0.4 mg tablet Place one tablet under tongue every 5 minutes as needed for Chest Pain. Indications: acute attack of angina    other medication Prevagen 1 tablet daily    Potassium Gluconate 595 mg (99 mg) tab Take one tablet by mouth daily.    pyridoxine (vitamin B6) 100 mg tablet Take one tablet by mouth daily.    rosuvastatin (CRESTOR) 20 mg tablet TAKE 1 TABLET DAILY FOR    EXCESSIVE FAT IN THE BLOOD    selenium 200 mcg cap Take one capsule  by mouth daily.    tadalafiL (CIALIS) 20 mg tablet Take one tablet by mouth as Needed for Erectile dysfunction.    tiotropium bromide (SPIRIVA RESPIMAT) 2.5 mcg/actuation inhaler Inhale two puffs by mouth into the lungs daily. (Patient not taking: Reported on 10/05/2023)

## 2023-10-23 LAB — COMPREHENSIVE METABOLIC PANEL
ALBUMIN: 3.7
ALK PHOSPHATASE: 106
ALT: 30
ANION GAP: 7 — ABNORMAL LOW (ref 8–16)
AST: 35 — ABNORMAL HIGH (ref 11–34)
BLD UREA NITROGEN: 14
CALCIUM: 9.5
CO2: 27
CREATININE: 1.2
GFR ESTIMATED: 57 — ABNORMAL LOW (ref >59)
GLUCOSE,PANEL: 124 — ABNORMAL HIGH (ref 70–105)
SODIUM: 141
TOTAL BILIRUBIN: 0.9
TOTAL PROTEIN: 6.9

## 2023-11-23 ENCOUNTER — Encounter: Admit: 2023-11-23 | Discharge: 2023-11-23 | Payer: MEDICARE

## 2023-11-23 MED ORDER — LOSARTAN 50 MG PO TAB
ORAL_TABLET | ORAL | 3 refills | 90.00000 days | Status: AC
Start: 2023-11-23 — End: ?

## 2024-04-16 ENCOUNTER — Encounter: Admit: 2024-04-16 | Discharge: 2024-04-16 | Payer: MEDICARE

## 2024-04-16 DIAGNOSIS — E7800 Pure hypercholesterolemia, unspecified: Secondary | ICD-10-CM

## 2024-04-16 DIAGNOSIS — I2510 Atherosclerotic heart disease of native coronary artery without angina pectoris: Principal | ICD-10-CM

## 2024-04-16 DIAGNOSIS — I10 Essential (primary) hypertension: Secondary | ICD-10-CM

## 2024-04-23 ENCOUNTER — Encounter: Admit: 2024-04-23 | Discharge: 2024-04-23 | Payer: MEDICARE

## 2024-04-23 ENCOUNTER — Ambulatory Visit: Admit: 2024-04-23 | Discharge: 2024-04-23 | Payer: MEDICARE

## 2024-04-23 DIAGNOSIS — R058 ACE-inhibitor cough: Secondary | ICD-10-CM

## 2024-04-23 DIAGNOSIS — G4733 Obstructive sleep apnea (adult) (pediatric): Secondary | ICD-10-CM

## 2024-04-23 DIAGNOSIS — R9431 Abnormal electrocardiogram [ECG] [EKG]: Secondary | ICD-10-CM

## 2024-04-23 DIAGNOSIS — I952 Hypotension due to drugs: Secondary | ICD-10-CM

## 2024-04-23 DIAGNOSIS — R0989 Other specified symptoms and signs involving the circulatory and respiratory systems: Secondary | ICD-10-CM

## 2024-04-23 DIAGNOSIS — E7800 Pure hypercholesterolemia, unspecified: Secondary | ICD-10-CM

## 2024-04-23 DIAGNOSIS — I491 Atrial premature depolarization: Secondary | ICD-10-CM

## 2024-04-23 DIAGNOSIS — I2111 ST elevation (STEMI) myocardial infarction involving right coronary artery: Principal | ICD-10-CM

## 2024-04-23 DIAGNOSIS — I10 Essential (primary) hypertension: Secondary | ICD-10-CM

## 2024-04-23 DIAGNOSIS — Z955 Presence of coronary angioplasty implant and graft: Secondary | ICD-10-CM

## 2024-04-23 DIAGNOSIS — I4719 Atrial tachycardia: Secondary | ICD-10-CM

## 2024-04-23 DIAGNOSIS — E039 Hypothyroidism, unspecified: Secondary | ICD-10-CM

## 2024-04-23 DIAGNOSIS — R001 Bradycardia, unspecified: Secondary | ICD-10-CM

## 2024-04-23 DIAGNOSIS — R9439 Abnormal result of other cardiovascular function study: Secondary | ICD-10-CM

## 2024-04-23 DIAGNOSIS — Z23 Encounter for immunization: Secondary | ICD-10-CM

## 2024-04-23 DIAGNOSIS — I2510 Atherosclerotic heart disease of native coronary artery without angina pectoris: Secondary | ICD-10-CM

## 2024-04-23 DIAGNOSIS — J449 Chronic obstructive pulmonary disease, unspecified: Secondary | ICD-10-CM

## 2024-04-23 DIAGNOSIS — I6521 Occlusion and stenosis of right carotid artery: Secondary | ICD-10-CM

## 2024-04-23 NOTE — Patient Instructions
Thank you for visiting our office today.    Continue the same medications as you have been doing.          We will be pursuing the following tests after your appointment today:      Carotid duplex      We will plan to see you back in 10-12 months.  Please call us in the meantime with any questions or concerns.        Please allow 5-7 business days for our providers to review your results. All normal results will go to MyChart. If you do not have Mychart, it is strongly recommended to get this so you can easily view all your results. If you do not have mychart, we will attempt to call you once with normal lab and testing results. If we cannot reach you by phone with normal results, we will send you a letter.  If you have not heard the results of your testing after one week please give [place] a call.       Your [facility] [place] Team Brett Canales, Pilar Jarvis and [place])  phone number is [phone].

## 2024-04-23 NOTE — Progress Notes
 Patient requests to have testing completed at outside facility. Carotid duplex orders faxed to [facility] scheduling at [phone].

## 2024-04-23 NOTE — Progress Notes
 Date of Service: 04/23/2024    Jack Wagner is a 83 y.o. male.       HPI      Jack Wagner is a 83 y.o. 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    Patient has not had symptoms of chest pain, history for sublingual nitroglycerin , no heart palpitations, no presyncope or syncope.    In March 2023 patient was evaluated with the following:     1.  Myocardial perfusion imaging study-the perfusion pattern was normal, did not show any ischemia, normal LVEF = 66%.  2.  Carotid artery duplex-no hemodynamically significant stenosis measured in the bilateral common and left internal carotid arteries, there are elevated velocities and turbulent flow in the right mid to distal segment of the internal carotid artery, very likely secondary to vessel tortuosity, normal antegrade flow in the bilateral vertebral arteries, no evidence of proximal subclavian.         Vitals:    04/23/24 1417   BP: 125/70   BP Source: Arm, Right Upper   Pulse: 54   SpO2: 96%   O2 Device: None (Room air)   PainSc: Zero   Weight: 79.9 kg (176 lb 3.2 oz)   Height: 182.9 cm (6')     Body mass index is 23.9 kg/m?SABRA     Past Medical History  Patient Active Problem List    Diagnosis Date Noted    COVID-19 vaccine administered 02/25/2020    OSA (obstructive sleep apnea) 05/09/2019     Home sleep study done on 72,020 showed moderate sleep apnea with AHI of 22.7.  Pt set up with CPAP @ 5-15cm H2O on 05/09/19.  DME: [facility]  Insurance: Medicare  If the patient has to meet compliance, their compliance window will be from 06/09/19 to 08/07/19. met      Abnormal Holter exam 04/30/2019 Fall 12/25/2018    Asymptomatic carotid artery stenosis, right 12/25/2018    Stented coronary artery 05/24/2018    Cough 10/03/2017    ACE-inhibitor cough 10/03/2017    Snoring 05/29/2017     - Witnessed apneas and snoring  - ESS 10      COPD (chronic obstructive pulmonary disease) (CMS-HCC) 05/29/2017     GOLD Staging: A1  mMRC: 1  GOLD classification of airflow obstruction: 1  Number exacerbations in past year: 0  PFT (05/29/2017)  FVC 4.49L (101% pred)  FEV1 2.65L (82% pred)  FEV1/FVC 59%  RV 2.98L (114% pred)  DLCO 94% pred  Inhaler Regimen  Spiriva 2 puffs once a day  Vaccinations  Influenza - Refused  Pneumococcal - Will need to check with PCP to ensure this has occured  Prevnar - Will need to check with PCP to ensure this has occured  Pulm Rehab   Currently performing cardiac rehab  Smoking  40 pk year, quit 10/2016  LDCT screening  Recommended to patient, he would like to consider and discuss further at upcoming appointment      Dysgeusia 04/12/2017     Added automatically from request for surgery [id number]      Weight loss 03/30/2017    Hypotension due to drugs 03/30/2017    BRBPR (bright red  blood per rectum) 02/23/2017     Added automatically from request for surgery [phone]      Colon cancer screening 02/23/2017     Added automatically from request for surgery [id number]      History of anemia 02/23/2017     Added automatically from request for surgery [id number]      Tobacco abuse 02/23/2017     - At least 50 pk years, quit 10/2016      Dysphagia 02/23/2017     Added automatically from request for surgery [id number]      Gastroesophageal reflux disease with esophagitis 02/22/2017    Cough 02/22/2017     - Chronic cough for years   - Some choking with eating   - All times per day   - Light yellow sputum production   - Possible post-nasal drip  - Exposure history:   - Previous chicken exposure   - Current down pillows (has slept on one his entire life)   - 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   - 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  - PFTs 05/29/2017: mild obstruction, normal lung volumes and diffusion capacity  - CXR 05/29/2017: no acute abnormality      Bad breath 02/22/2017    Bright red rectal bleeding 02/22/2017    Other dysphagia 02/22/2017    Prediabetes 11/25/2016    Atrial tachycardia 11/24/2016    ST elevation myocardial infarction involving right coronary artery (CMS-HCC) 11/23/2016    Bradycardia 05/16/2013    Renal bruit 05/16/2013    Dyspnea 03/01/2011    History of tobacco use 03/01/2011    PAC (premature atrial contraction) 04/08/2010    Abnormal cardiovascular stress test 11/12/2008    CAD (coronary artery disease) 11/12/2008     11/12/08: Cath - Ostial left main stenosis of 20-30%. Proximal LAD stenosis of 30-40%. Diagonal ostial stenosis of 60%. Mid circumflex with 30-40% stenosis. Mid RCA with a 30-40% stenosis. Medical management.    11/23/16: STEMI/Cath - Culprit lesion, subtotal 99% proximal RCA acute thrombotic lesion s/p PCI with a 3.5 x 33 mm Xience Alpine DES. Ostial 20-30% left main lesion, which appears angiographically unchanged compared to the previous films.        HTN (hypertension) 11/12/2008    HLD (hyperlipidemia) 11/12/2008    Hypothyroidism 11/12/2008         Review of Systems   Constitutional: Positive for malaise/fatigue.   HENT: Negative.     Eyes: Negative.    Cardiovascular:  Positive for dyspnea on exertion.   Respiratory:  Positive for shortness of breath.    Endocrine: Negative.    Hematologic/Lymphatic: Negative.    Skin: Negative.    Musculoskeletal: Negative.    Gastrointestinal: Negative.    Genitourinary: Negative.    Neurological: Negative.    Psychiatric/Behavioral: Negative.     Allergic/Immunologic: Negative.        Physical Exam  General Appearance: normal in appearance  Skin: warm, moist, no ulcers or xanthomas  Eyes: conjunctivae and lids normal, pupils are equal and round  Lips & Oral Mucosa: no pallor or cyanosis  Neck Veins: neck veins are flat, neck veins are not distended  Chest Inspection: chest is normal in appearance  Respiratory Effort: breathing comfortably, no respiratory distress  Auscultation/Percussion: lungs clear to auscultation, no rales or rhonchi, no wheezing  Cardiac Rhythm: regular rhythm and normal rate  Cardiac Auscultation: S1, S2 normal, no rub, no gallop  Murmurs: no murmur  Carotid Arteries: normal carotid upstroke bilaterally,  bilateral bruits  Lower Extremity Edema: no lower extremity edema  Abdominal Exam: soft, non-tender, no masses, bowel sounds normal  Liver & Spleen: no organomegaly  Language and Memory: patient responsive and seems to comprehend information  Neurologic Exam: neurological assessment grossly intact      Cardiovascular Studies      Cardiovascular Health Factors  Vitals BP Readings from Last 3 Encounters:   04/23/24 125/70   10/05/23 138/75   12/08/22 (!) 162/80     Wt Readings from Last 3 Encounters:   04/23/24 79.9 kg (176 lb 3.2 oz)   10/05/23 85.4 kg (188 lb 3.2 oz)   12/08/22 80.3 kg (177 lb)     BMI Readings from Last 3 Encounters:   04/23/24 23.90 kg/m?   10/05/23 25.52 kg/m?   12/08/22 24.01 kg/m?      Smoking Tobacco Use History[1]   Lipid Profile Cholesterol   Date Value Ref Range Status   10/11/2021 138  Final     HDL   Date Value Ref Range Status   10/11/2021 55  Final     LDL   Date Value Ref Range Status   10/11/2021 66  Final     Triglycerides   Date Value Ref Range Status   10/11/2021 86  Final      Blood Sugar Hemoglobin A1C   Date Value Ref Range Status   11/23/2016 6.2 (H) 4.0 - 6.0 % Final     Comment:     The ADA recommends that most patients with type 1 and type 2 diabetes maintain   an A1c level <7%.       Glucose   Date Value Ref Range Status   10/23/2023 124 (H) 70 - 105 Final   03/17/2023 122 (H) 70 - 105 Final   10/11/2021 97  Final          Problems Addressed Today  Encounter Diagnoses   Name Primary?    ST elevation myocardial infarction involving right coronary artery (CMS-HCC) Yes    PAC (premature atrial contraction)     Hypotension due to drugs     Primary hypertension     Pure hypercholesterolemia     Coronary artery disease involving native coronary artery of native heart without angina pectoris     Atrial tachycardia     Asymptomatic carotid artery stenosis, right     Stented coronary artery     OSA (obstructive sleep apnea)     Acquired hypothyroidism     COVID-19 vaccine administered     Chronic obstructive pulmonary disease, unspecified COPD type (CMS-HCC)     Bradycardia     ACE-inhibitor cough     Abnormal Holter exam     Abnormal cardiovascular stress test     Bruit        Assessment and Plan     Assessment:    1.  Nonobstructive carotid artery disease  Carotid artery duplex was performed on 11/01/2021  2.  Coronary artery disease-no symptoms of angina no use of sublingual nitroglycerin   3.  History of ST elevation IWMI, status post PCI to proximal RCA in March 2018  The most recent perfusion imaging study performed in March 2023 did not demonstrate ischemia, normal LVEF = 66%  4.  Hyperlipidemia-on statin therapy  5.  Asymptomatic bradycardia  Patient was evaluated with a Holter monitor in June 2021, he was not found to have any evidence of significant bradycardia arrhythmias no high degree AV block  6.  OSA-using the CPAP machine  9.  History of ACE inhibitor induced cough- patient is on an ARB  10.  Mid right lower lobe pleural-based soft tissue density -- this nodule has been followed seriously with a chest CT, the most recent imaging study dated 10/11/2021 demonstrated stability, 1 year follow-up was recommended  11.  Erectile dysfunction-on Cialis   12.  Primary hypertension-suboptimally controlled      Plan:    1.  Patient will be evaluated with a carotid duplex in the light of the presence of carotid bruit  2.  Continue current medications  3.  Will follow-up on the results of blood test and we will call with further recommendations  4.  Follow-up office visit in 10 to 12 months      Total Time Today was 40 minutes in the following activities: Preparing to see the patient, Obtaining and/or reviewing separately obtained history, Performing a medically appropriate examination and/or evaluation, Counseling and educating the patient/family/caregiver, Ordering medications, tests, or procedures, Referring and communication with other health care professionals (when not separately reported), Documenting clinical information in the electronic or other health record, Independently interpreting results (not separately reported) and communicating results to the patient/family/caregiver, and Care coordination (not separately reported)          Current Medications (including today's revisions)   albuterol  sulfate (PROAIR  HFA) 90 mcg/actuation HFA aerosol inhaler Inhale two puffs by mouth into the lungs every 6 hours as needed for Wheezing or Shortness of Breath. Shake well before use.    ascorbic acid (vitamin C) (VITAMIN C) 1,000 mg tablet Take one tablet by mouth daily.    Aspirin 81 mg Tab Take one tablet by mouth daily.    Calcium Carbonate (CORAL CALCIUM) 1,000 mg tab Take one tablet by mouth daily.    cholecalciferol (VITAMIN D3) 50 mcg (2,000 unit) tablet Take one tablet by mouth daily.    cyanocobalamin 1,000 mcg tablet Take one tablet by mouth daily.    folic acid/multivit-min/lutein (CENTRUM SILVER PO) Take 1 tablet by mouth daily.    glucosamine HCl/chondroitin su (GLUCOSAMINE-CHONDROITIN PO) Take 1 tablet by mouth daily.    lansoprazole(+) DR (PREVACID) 15 mg capsule Take one capsule by mouth daily 30 minutes before breakfast.    levothyroxine  (SYNTHROID ) 125 mcg tablet Take one tablet by mouth daily.    losartan  (COZAAR ) 50 mg tablet TAKE 1 TABLET TWICE A DAY  FOR HIGH BLOOD PRESSURE    nitroglycerin  (NITROSTAT ) 0.4 mg tablet Place one tablet under tongue every 5 minutes as needed for Chest Pain. Indications: acute attack of angina    other medication Prevagen 1 tablet daily    Potassium Gluconate 595 mg (99 mg) tab Take one tablet by mouth daily.    pyridoxine (vitamin B6) 100 mg tablet Take one tablet by mouth daily.    rosuvastatin  (CRESTOR ) 20 mg tablet TAKE 1 TABLET DAILY FOR    EXCESSIVE FAT IN THE BLOOD    selenium 200 mcg cap Take one capsule by mouth daily.    tadalafiL  (CIALIS ) 20 mg tablet Take one tablet by mouth as Needed for Erectile dysfunction.    TRELEGY ELLIPTA 100-62.5-25 mcg inhaler Inhale one puff by mouth into the lungs daily.                 [1]   Social History  Tobacco Use   Smoking Status Every Day    Types: Cigars, Pipe   Smokeless Tobacco Former    Types: Sports administrator  Quit date: 11/23/2016   Tobacco Comments    smokes pipe and chews tobacco daily

## 2024-04-24 ENCOUNTER — Encounter: Admit: 2024-04-24 | Discharge: 2024-04-24 | Payer: MEDICARE

## 2024-04-24 MED ORDER — ROSUVASTATIN 20 MG PO TAB
20 mg | ORAL_TABLET | Freq: Every day | ORAL | 1 refills | 90.00000 days | Status: AC
Start: 2024-04-24 — End: ?

## 2024-05-30 ENCOUNTER — Encounter: Admit: 2024-05-30 | Discharge: 2024-05-30 | Payer: MEDICARE

## 2024-05-30 NOTE — Progress Notes
 The following are the results of carotid duplex from outside facility. Results have been reviewed by Dr. Velora in clinic no changes in plan of care at this time.    m
